# Patient Record
Sex: Female | Born: 1935 | Race: White | Hispanic: No | Smoking: Never smoker
Health system: Southern US, Community
[De-identification: ages and names within clinical notes are randomized; demographics above are authoritative.]

## PROBLEM LIST (undated history)

## (undated) DIAGNOSIS — R0600 Dyspnea, unspecified: Secondary | ICD-10-CM

## (undated) DIAGNOSIS — J449 Chronic obstructive pulmonary disease, unspecified: Secondary | ICD-10-CM

## (undated) DIAGNOSIS — K219 Gastro-esophageal reflux disease without esophagitis: Secondary | ICD-10-CM

## (undated) DIAGNOSIS — K5792 Diverticulitis of intestine, part unspecified, without perforation or abscess without bleeding: Secondary | ICD-10-CM

## (undated) DIAGNOSIS — I219 Acute myocardial infarction, unspecified: Secondary | ICD-10-CM

## (undated) DIAGNOSIS — IMO0002 Reserved for concepts with insufficient information to code with codable children: Secondary | ICD-10-CM

## (undated) DIAGNOSIS — I2510 Atherosclerotic heart disease of native coronary artery without angina pectoris: Secondary | ICD-10-CM

## (undated) DIAGNOSIS — I209 Angina pectoris, unspecified: Secondary | ICD-10-CM

## (undated) DIAGNOSIS — F419 Anxiety disorder, unspecified: Secondary | ICD-10-CM

## (undated) DIAGNOSIS — E039 Hypothyroidism, unspecified: Secondary | ICD-10-CM

## (undated) DIAGNOSIS — I10 Essential (primary) hypertension: Secondary | ICD-10-CM

## (undated) DIAGNOSIS — I639 Cerebral infarction, unspecified: Secondary | ICD-10-CM

## (undated) HISTORY — DX: Anxiety disorder, unspecified: F41.9

## (undated) HISTORY — DX: Reserved for concepts with insufficient information to code with codable children: IMO0002

## (undated) HISTORY — PX: KNEE ARTHROSCOPY: SUR90

## (undated) HISTORY — PX: COCCYX REMOVAL: SHX600

## (undated) HISTORY — DX: Diverticulitis of intestine, part unspecified, without perforation or abscess without bleeding: K57.92

## (undated) HISTORY — PX: LAPAROSCOPIC LOW ANTERIOR RESECTION: SHX5904

## (undated) HISTORY — PX: APPENDECTOMY: SHX54

## (undated) HISTORY — DX: Essential (primary) hypertension: I10

---

## 2001-06-08 ENCOUNTER — Other Ambulatory Visit: Admission: RE | Admit: 2001-06-08 | Discharge: 2001-06-08 | Payer: Self-pay | Admitting: Obstetrics and Gynecology

## 2003-06-14 ENCOUNTER — Ambulatory Visit (HOSPITAL_COMMUNITY): Admission: RE | Admit: 2003-06-14 | Discharge: 2003-06-14 | Payer: Self-pay | Admitting: Obstetrics and Gynecology

## 2004-05-24 ENCOUNTER — Ambulatory Visit (HOSPITAL_COMMUNITY): Admission: RE | Admit: 2004-05-24 | Discharge: 2004-05-24 | Payer: Self-pay | Admitting: Pulmonary Disease

## 2004-07-19 ENCOUNTER — Ambulatory Visit (HOSPITAL_COMMUNITY): Admission: RE | Admit: 2004-07-19 | Discharge: 2004-07-19 | Payer: Self-pay | Admitting: Pulmonary Disease

## 2005-07-14 ENCOUNTER — Ambulatory Visit (HOSPITAL_COMMUNITY): Admission: RE | Admit: 2005-07-14 | Discharge: 2005-07-14 | Payer: Self-pay | Admitting: Pulmonary Disease

## 2006-01-13 ENCOUNTER — Ambulatory Visit (HOSPITAL_COMMUNITY): Admission: RE | Admit: 2006-01-13 | Discharge: 2006-01-13 | Payer: Self-pay | Admitting: Pulmonary Disease

## 2006-01-22 ENCOUNTER — Ambulatory Visit (HOSPITAL_COMMUNITY): Admission: RE | Admit: 2006-01-22 | Discharge: 2006-01-22 | Payer: Self-pay | Admitting: Pulmonary Disease

## 2006-05-05 ENCOUNTER — Ambulatory Visit (HOSPITAL_COMMUNITY): Admission: RE | Admit: 2006-05-05 | Discharge: 2006-05-05 | Payer: Self-pay | Admitting: General Surgery

## 2007-02-18 ENCOUNTER — Ambulatory Visit (HOSPITAL_COMMUNITY): Admission: RE | Admit: 2007-02-18 | Discharge: 2007-02-18 | Payer: Self-pay | Admitting: Pulmonary Disease

## 2007-04-16 ENCOUNTER — Ambulatory Visit: Payer: Self-pay | Admitting: Vascular Surgery

## 2007-04-28 ENCOUNTER — Ambulatory Visit: Payer: Self-pay | Admitting: Vascular Surgery

## 2007-05-05 ENCOUNTER — Ambulatory Visit: Payer: Self-pay | Admitting: Vascular Surgery

## 2007-05-26 ENCOUNTER — Ambulatory Visit: Payer: Self-pay | Admitting: Vascular Surgery

## 2008-01-31 ENCOUNTER — Ambulatory Visit (HOSPITAL_COMMUNITY): Admission: RE | Admit: 2008-01-31 | Discharge: 2008-01-31 | Payer: Self-pay | Admitting: Pulmonary Disease

## 2008-02-22 ENCOUNTER — Ambulatory Visit (HOSPITAL_COMMUNITY): Admission: RE | Admit: 2008-02-22 | Discharge: 2008-02-22 | Payer: Self-pay | Admitting: Obstetrics and Gynecology

## 2009-03-21 ENCOUNTER — Ambulatory Visit (HOSPITAL_COMMUNITY): Admission: RE | Admit: 2009-03-21 | Discharge: 2009-03-21 | Payer: Self-pay | Admitting: Pulmonary Disease

## 2009-05-09 ENCOUNTER — Other Ambulatory Visit: Admission: RE | Admit: 2009-05-09 | Discharge: 2009-05-09 | Payer: Self-pay | Admitting: Obstetrics and Gynecology

## 2010-03-26 ENCOUNTER — Ambulatory Visit (HOSPITAL_COMMUNITY): Admission: RE | Admit: 2010-03-26 | Discharge: 2010-03-26 | Payer: Self-pay | Admitting: Pulmonary Disease

## 2010-12-24 NOTE — Assessment & Plan Note (Signed)
OFFICE VISIT   [name], Cheryl A  DOB:  08/13/1932                                       04/16/2007  EAVWU#:[id number]   FOLLOWUP:  The patient walks in today for continued follow up of her  severe venous hypertension in her right leg.  I had last seen her in  October 2007.  She was having significant pain related to her venous  varicosities at that time.  She has been wearing her graduated  compression stockings thigh high and has had diminishing results with  these.  She reports now that she has pain throughout her right leg and  specifically over her varicosities that limits her daily activities.  She reports specifically it interferes with her house work and  activities of daily living.   PHYSICAL EXAMINATION:  On physical exam, there is no significant change.  She does have marked varicosities throughout her saphenous vein and  tributaries throughout her thigh and calf.  She has no skin breakdown at  her ankle.  She underwent repeat duplex since it had been over one year  since her last study and this did confirm that she continues to have a  patent deep system on the right with no evidence of reflux on the right.  She does have marked reflux throughout her saphenous vein with extensive  tributary varicosities also saphenous vein.   RECOMMENDATIONS:  I recommend that we proceed with right leg laser  ablation and stat phlebectomy for relief of her symptoms.  I discussed  the procedure as outpatient under local anesthesia.  She does report  that she had some issue regarding local anesthesia with her oral  surgeon.  We will obtain information regarding this prior to proceeding.   Larina Earthly, M.D.  Electronically Signed   TFE/MEDQ  D:  04/16/2007  T:  04/19/2007  Job:  [id number]

## 2010-12-24 NOTE — Procedures (Signed)
LOWER EXTREMITY VENOUS REFLUX EXAM   INDICATION:  Chronic right leg varicose vein   EXAM:  Using color-flow imaging and pulse Doppler spectral analysis, the  right common femoral, superficial femoral, popliteal, posterior tibial,  greater and lesser saphenous veins are evaluated.  There is no evidence  suggesting deep venous insufficiency in the right lower extremity.   The right saphenofemoral junction is not competent.  The right GSV is  not competent with the caliber as described below.   The right proximal short saphenous vein was not identified.   GSV Diameter (used if found to be incompetent only)                                            Right    Left  Proximal Greater Saphenous Vein           0.9 cm   cm  Proximal-to-mid-thigh                     0.6 cm   cm  Mid thigh                                 0.6 cm   cm  Mid-distal thigh                          1.8 cm   cm  Distal thigh                              0.4 cm   cm  Knee                                      0.9 cm   cm   IMPRESSION:  1. Right greater saphenous vein reflux is identified with the caliber      ranging from 0.4 cm to 1.8 cm knee to groin.  2. 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  3. The right greater saphenous vein is tortuous in the distal      thigh/popliteal area.  4. There is no evidence of significant deep venous insufficiency in      the left lower extremity.  5. The deep venous system is competent.  6. The right lesser saphenous vein is not identified.   ___________________________________________  Larina Earthly, M.D.   MC/MEDQ  D:  04/16/2007  T:  04/16/2007  Job:  [id number]

## 2010-12-24 NOTE — Assessment & Plan Note (Signed)
OFFICE VISIT   [name], Cheryl A  DOB:  [birth year]                                       05/05/2007  WUXLK#:[id number]   Patient presents today for followup of her right leg laser ablation and  stab phlebectomy.  She has done well with the usual amount of  discomfort.  She does have an area of approximately 2 x 3 cm over the  lateral knee in the web space, where it looks as though the compression  garment and Ace wrap has caused a superficial ulceration.  I explained  to her wound care for this.  She is going to [place] in four weeks, and  we will see her again in three weeks to assure that everything looks  okay.  She did undergo a duplex today, which showed closure of her  saphenous vein and wide patency of her common femoral vein with no  evidence of thrombus or injury.   Larina Earthly, M.D.  Electronically Signed   TFE/MEDQ  D:  05/05/2007  T:  05/06/2007  Job:  [id number]

## 2010-12-24 NOTE — Assessment & Plan Note (Signed)
OFFICE VISIT   [name], [name]  DOB:  [birth year]                                       05/26/2007  [id number]#:[id number]   Patient presents today for continued followup of her laser ablation/stab  phlebectomy of her right greater saphenous vein and tributaries.  She  had an irritation in the lateral space behind her right knee with  irritation from the compression garment and actual ulceration over this  area.  She is seen today for continued followup of this.  She does have  the usual amount of thickness in the areae of the laser ablation and  also stab phlebectomies.  The area now has an eschar present over the  area behind her right lateral knee.  This shows no evidence of  infection.  She reports that she has had some mild bleeding from this  occasionally, but this has now resolved.   I am pleased with her recovery.  She is heading to [place] for the  winter and will notify us when she returns in the spring for a final  check on this.   Larina Earthly, M.D.  Electronically Signed   TFE/MEDQ  D:  05/26/2007  T:  05/27/2007  Job:  [id number]

## 2010-12-27 NOTE — H&P (Signed)
Cheryl [name], Cheryl [name]                ACCOUNT NO.:  [id number]   MEDICAL RECORD NO.:  [id number]           PATIENT TYPE:  AMB   LOCATION:                                FACILITY:  [facility]   PHYSICIAN:  Dalia Heading, M.D.  DATE OF BIRTH:  Dec 18, 1932   DATE OF ADMISSION:  DATE OF DISCHARGE:  LH                                HISTORY & PHYSICAL   CHIEF COMPLAINT:  Colon polyps.   HISTORY OF PRESENT ILLNESS:  The patient is a 77 year old white female who  is referred for endoscopic evaluation.  She needs a colonoscopy for follow-  up of colon polyps.  She had a colonoscopy with polypectomy 6 years ago in  [place].  No abdominal pain, weight loss, nausea, vomiting, diarrhea,  constipation, melena, hematochezia have been noted.  There is no family  history of colon carcinoma.   CURRENT MEDICATIONS:  Plavix which she is holding, verapamil, doxazosin,  Actonel, indapamide.   ALLERGIES:  PENICILLIN.   PAST MEDICAL HISTORY:  Hypertension.   PAST SURGICAL HISTORY:  Appendectomy.   REVIEW OF SYSTEMS:  Noncontributory.   PHYSICAL EXAMINATION:  GENERAL APPEARANCE:  The patient is a well-developed,  well-nourished white female in no acute distress.  LUNGS:  Clear to auscultation with equal breath sounds bilaterally.  HEART:  Regular rate and rhythm without S3, S4 or murmurs.  ABDOMEN:  Soft, nontender, nondistended.  No hepatosplenomegaly or masses  noted.  RECTAL:  Examination was deferred to the procedure.   IMPRESSION:  History of colon polyps.   PLAN:  The patient was scheduled for colonoscopy on May 05, 2006.  Risks and benefits of the procedure including bleeding and perforation were  fully explained to the patient, gave informed consent.      Dalia Heading, M.D.  Electronically Signed     MAJ/MEDQ  D:  04/28/2006  T:  04/28/2006  Job:  [id number]   cc:   Ramon Dredge L. Juanetta Gosling, M.D.  Fax: [phone]

## 2011-04-21 ENCOUNTER — Other Ambulatory Visit (HOSPITAL_COMMUNITY): Payer: Self-pay | Admitting: Pulmonary Disease

## 2011-04-21 DIAGNOSIS — Z139 Encounter for screening, unspecified: Secondary | ICD-10-CM

## 2011-04-24 ENCOUNTER — Ambulatory Visit (HOSPITAL_COMMUNITY)
Admission: RE | Admit: 2011-04-24 | Discharge: 2011-04-24 | Disposition: A | Discharge status: Home / Self Care | Payer: Medicare Other | Source: Ambulatory Visit | Attending: Pulmonary Disease | Admitting: Pulmonary Disease

## 2011-04-24 DIAGNOSIS — Z1231 Encounter for screening mammogram for malignant neoplasm of breast: Secondary | ICD-10-CM | POA: Insufficient documentation

## 2011-04-24 DIAGNOSIS — Z139 Encounter for screening, unspecified: Secondary | ICD-10-CM

## 2011-05-14 ENCOUNTER — Other Ambulatory Visit (HOSPITAL_COMMUNITY)
Admission: RE | Admit: 2011-05-14 | Discharge: 2011-05-14 | Disposition: A | Discharge status: Home / Self Care | Payer: Medicare Other | Source: Ambulatory Visit | Attending: Obstetrics and Gynecology | Admitting: Obstetrics and Gynecology

## 2011-05-14 ENCOUNTER — Other Ambulatory Visit: Payer: Self-pay | Admitting: Obstetrics and Gynecology

## 2011-05-14 DIAGNOSIS — Z124 Encounter for screening for malignant neoplasm of cervix: Secondary | ICD-10-CM | POA: Insufficient documentation

## 2012-02-26 ENCOUNTER — Other Ambulatory Visit: Payer: Self-pay | Admitting: Obstetrics and Gynecology

## 2012-04-21 ENCOUNTER — Other Ambulatory Visit (HOSPITAL_COMMUNITY): Payer: Self-pay | Admitting: Pulmonary Disease

## 2012-04-21 DIAGNOSIS — Z139 Encounter for screening, unspecified: Secondary | ICD-10-CM

## 2012-04-27 ENCOUNTER — Ambulatory Visit (HOSPITAL_COMMUNITY)
Admission: RE | Admit: 2012-04-27 | Discharge: 2012-04-27 | Disposition: A | Discharge status: Home / Self Care | Payer: Medicare Other | Source: Ambulatory Visit | Attending: Pulmonary Disease | Admitting: Pulmonary Disease

## 2012-04-27 DIAGNOSIS — Z139 Encounter for screening, unspecified: Secondary | ICD-10-CM

## 2012-04-27 DIAGNOSIS — Z1231 Encounter for screening mammogram for malignant neoplasm of breast: Secondary | ICD-10-CM | POA: Insufficient documentation

## 2012-07-15 ENCOUNTER — Other Ambulatory Visit (HOSPITAL_COMMUNITY): Payer: Self-pay | Admitting: Pulmonary Disease

## 2012-07-15 ENCOUNTER — Ambulatory Visit (HOSPITAL_COMMUNITY)
Admission: RE | Admit: 2012-07-15 | Discharge: 2012-07-15 | Disposition: A | Discharge status: Home / Self Care | Payer: Medicare Other | Source: Ambulatory Visit | Attending: Pulmonary Disease | Admitting: Pulmonary Disease

## 2012-07-15 DIAGNOSIS — K625 Hemorrhage of anus and rectum: Secondary | ICD-10-CM

## 2012-07-15 DIAGNOSIS — R109 Unspecified abdominal pain: Secondary | ICD-10-CM | POA: Insufficient documentation

## 2012-07-15 DIAGNOSIS — M4120 Other idiopathic scoliosis, site unspecified: Secondary | ICD-10-CM | POA: Insufficient documentation

## 2012-07-15 DIAGNOSIS — K8020 Calculus of gallbladder without cholecystitis without obstruction: Secondary | ICD-10-CM | POA: Insufficient documentation

## 2012-07-15 MED ORDER — IOHEXOL 300 MG/ML  SOLN
100.0000 mL | Freq: Once | INTRAMUSCULAR | Status: AC | PRN
  Administered 2012-07-15: 100 mL via INTRAVENOUS

## 2012-12-08 HISTORY — PX: FLEXIBLE SIGMOIDOSCOPY: SHX1649

## 2012-12-09 HISTORY — PX: LAPAROSCOPY: SHX197

## 2012-12-13 HISTORY — PX: COLOSTOMY: SHX63

## 2013-01-09 HISTORY — PX: COLONOSCOPY: SHX174

## 2013-06-01 ENCOUNTER — Other Ambulatory Visit (HOSPITAL_COMMUNITY): Payer: Self-pay | Admitting: Pulmonary Disease

## 2013-06-01 DIAGNOSIS — Z139 Encounter for screening, unspecified: Secondary | ICD-10-CM

## 2013-06-06 ENCOUNTER — Ambulatory Visit (HOSPITAL_COMMUNITY): Payer: Medicare Other

## 2013-06-24 ENCOUNTER — Encounter: Payer: Self-pay | Admitting: Obstetrics and Gynecology

## 2013-06-24 ENCOUNTER — Other Ambulatory Visit (HOSPITAL_COMMUNITY)
Admission: RE | Admit: 2013-06-24 | Discharge: 2013-06-24 | Disposition: A | Discharge status: Home / Self Care | Payer: Medicare Other | Source: Ambulatory Visit | Attending: Obstetrics and Gynecology | Admitting: Obstetrics and Gynecology

## 2013-06-24 ENCOUNTER — Ambulatory Visit (INDEPENDENT_AMBULATORY_CARE_PROVIDER_SITE_OTHER): Payer: Medicare Other | Admitting: Obstetrics and Gynecology

## 2013-06-24 VITALS — BP 140/84 | Ht 60.5 in | Wt 127.0 lb

## 2013-06-24 DIAGNOSIS — Z124 Encounter for screening for malignant neoplasm of cervix: Secondary | ICD-10-CM | POA: Insufficient documentation

## 2013-06-24 DIAGNOSIS — Z1151 Encounter for screening for human papillomavirus (HPV): Secondary | ICD-10-CM | POA: Insufficient documentation

## 2013-06-24 NOTE — Patient Instructions (Signed)
Please let your surgeon know of your exam today, and that the Hemoccult remains positive.  This is very likely related to recent bowel surgery, but must be discusssed with the general surgeon

## 2013-06-24 NOTE — Progress Notes (Signed)
Patient ID: [name], female   DOB: 05/08/1933, 79 y.o.   MRN: [id number] Pt states that she has a dropped bladder and had a pessary, it fell out and she never reinserted it and states that it doesn't cause her any trouble now. Pt denies any problems or concerns at this time.    Assessment:  Annual Gyn Exam  Positive hemoccult,  S/p recent colostomy and reversal. Moving back to [place] in 10 days Plan:  1. pap smear done, next pap due 3 yr 2. Pt to notify gen surgeon of + hemoccult when she returns to [facility] in 10 days 3. return annually or prn 3    Annual mammogram advised Subjective:  IDELL HISSONG is a 79 y.o. female No obstetric history on file. who presents for annual exam. No LMP recorded. Patient is postmenopausal. The patient has complaints today of none, has had a lengthy list of medical problemes this year , including colostomy and reversal for diverticular disease, no cancer Patient brings lengthy records from [place]  The following portions of the patient's history were reviewed and updated as appropriate: allergies, current medications, past family history, past medical history, past social history, past surgical history and problem list.  Review of Systems Constitutional: negative Gastrointestinal: positive for diverticulosis, bowel blockage requiring colostomy and reversal Genitourinary: neg  Objective:  BP 140/84  Ht 5' 0.5" (1.537 m)  Wt 127 lb (57.607 kg)  BMI 24.39 kg/m2   BMI: Body mass index is 24.39 kg/(m^2).  General Appearance: Alert, appropriate appearance for age. No acute distress HEENT: Grossly normal Neck / Thyroid:  Cardiovascular: RRR; normal S1, S2, no murmur Lungs: CTA bilaterally Back: No CVAT Breast Exam: No dimpling, nipple retraction or discharge. No masses or nodes. and No masses or nodes.No dimpling, nipple retraction or discharge. Gastrointestinal: Soft, non-tender, no masses or organomegaly Pelvic Exam: Vulva and vagina appear normal.  Bimanual exam reveals normal uterus and adnexa. Rectovaginal: normal rectal, no masses and guaiac positive stool obtained Lymphatic Exam: Non-palpable nodes in neck, clavicular, axillary, or inguinal regions Skin: no rash or abnormalities Neurologic: Normal gait and speech, no tremor  Psychiatric: Alert and oriented, appropriate affect.  Urinalysis:Not done  Christin Bach. MD Pgr [phone] 9:21 AM

## 2013-06-28 ENCOUNTER — Encounter: Payer: Self-pay | Admitting: Gastroenterology

## 2013-06-28 ENCOUNTER — Ambulatory Visit (INDEPENDENT_AMBULATORY_CARE_PROVIDER_SITE_OTHER): Payer: Medicare Other | Admitting: Gastroenterology

## 2013-06-28 ENCOUNTER — Encounter (HOSPITAL_COMMUNITY)
Admission: RE | Admit: 2013-06-28 | Discharge: 2013-06-28 | Disposition: A | Discharge status: Home / Self Care | Payer: Medicare Other | Source: Ambulatory Visit | Attending: Pulmonary Disease | Admitting: Pulmonary Disease

## 2013-06-28 ENCOUNTER — Telehealth: Payer: Self-pay | Admitting: Gastroenterology

## 2013-06-28 VITALS — BP 160/70 | HR 78 | Temp 97.1°F | Ht 61.0 in | Wt 127.4 lb

## 2013-06-28 DIAGNOSIS — D649 Anemia, unspecified: Secondary | ICD-10-CM

## 2013-06-28 LAB — PREPARE RBC (CROSSMATCH)

## 2013-06-28 NOTE — Patient Instructions (Signed)
We have scheduled you for an upper endoscopy in the near future.  IF you see any black, tarry stool or bright red blood in your stool, seek emergency medical attention.  Keep appointment for blood transfusion.  Further recommendations to follow shortly.

## 2013-06-28 NOTE — Telephone Encounter (Signed)
Patient needs to be seen in an urgent slot tomorrow, 11/19. May put with me.   She is getting blood today. I will put the records back up front so they can be with her chart.

## 2013-06-28 NOTE — Progress Notes (Signed)
Primary Care Physician:  Fredirick Maudlin, MD Primary Gastroenterologist:  Dr. Darrick Penna   Chief Complaint  Patient presents with  . Anemia    abnormal labs    HPI:   Cheryl [name] presents today as an urgent work-in at the request of Dr. Juanetta Gosling due to anemia. Found to be heme positive through GYN office. Labs through PCP revealed Hgb 6.1, microcytic anemia. Unclear baseline.   Lives in [place] as main residence but comes here to visit. Diverticulitis Dec 2013, then hospitalized in May 2014 in [place]. After review of multiple records, it appears patient was hospitalized due to complicated diverticulitis with microperforation, sigmoid stricture, and bowel obstruction. She underwent a diverting colostomy in May 2014. She had a flex sig and colonoscopy this summer. Colonoscopy was via colostomy and rectum. No obvious mass or polyp noted. Colostomy reversed at some point shortly after that.   EGD in remote past. Capsule study in remote past. She is unsure how long ago but believes at least 10 years. Denies melena. No hematochezia. Notes chronic LLQ "tenderness", discomfort. Appetite "pretty good". No weight loss. No dysphagia. +reflux. +belching. No PPI. On aspirin 325 mg and Plavix 75 mg. No BC powders or Goody powders. No other NSAIDs. Reports early satiety.   Blood transfusion scheduled for tomorrow.   Past Medical History  Diagnosis Date  . Diverticulitis     complicated, with microperforation, sigmoid stricture  . Degenerative disc disease   . Tuberculosis   . Hypertension   . Anxiety   . Bulging discs     Past Surgical History  Procedure Laterality Date  . Colostomy  Dec 13, 2012    Dec 13, 2012 placed. Reversed   . Laparoscopy  May 2014  . Appendectomy    . Knee arthroscopy Right   . Coccyx removal    . Flexible sigmoidoscopy  December 08, 2012    Dr. Kerby Less in [place]: very edematous sigmoid colon, unable to advance scope past 25-30 cm despite attempt with peds  scope.   . Colonoscopy  June 2014    Dr. Aliene Beams in [place]: via colostomy and rectum. scattered diverticula. no obvious polyps. Hemorrhoids noted in anal canal. Exam limited due to poor prep  . Laparoscopic low anterior resection  summer 2014    with reversal of colostomy    Current Outpatient Prescriptions  Medication Sig Dispense Refill  . alendronate (FOSAMAX) 70 MG tablet       . aspirin 325 MG tablet Take 325 mg by mouth daily.      . clopidogrel (PLAVIX) 75 MG tablet       . indapamide (LOZOL) 1.25 MG tablet       . levothyroxine (SYNTHROID, LEVOTHROID) 50 MCG tablet       . potassium chloride (MICRO-K) 10 MEQ CR capsule 2 (two) times daily.       . verapamil (CALAN-SR) 180 MG CR tablet 2 (two) times daily.        No current facility-administered medications for this visit.    Allergies as of 06/28/2013 - Review Complete 06/28/2013  Allergen Reaction Noted  . Penicillins Hives 06/24/2013    Family History  Problem Relation Age of Onset  . Heart disease Mother   . Stroke Father   . Heart disease Sister   . Colon cancer Neg Hx     History   Social History  . Marital Status: Widowed    Spouse Name: N/A    Number of Children:  N/A  . Years of Education: N/A   Occupational History  . Not on file.   Social History Main Topics  . Smoking status: Never Smoker   . Smokeless tobacco: Never Used  . Alcohol Use: Yes     Comment: occasional  . Drug Use: No  . Sexual Activity: Not Currently    Birth Control/ Protection: Post-menopausal   Other Topics Concern  . Not on file   Social History Narrative  . No narrative on file    Review of Systems: Gen: see HPI CV: Denies chest pain, heart palpitations, peripheral edema, syncope.  Resp: Denies shortness of breath at rest or with exertion. Denies wheezing or cough.  GI: see HPI GU : Denies urinary burning, urinary frequency, urinary hesitancy MS: arthritis Derm: +dry skin Psych: Denies depression, anxiety,  memory loss, and confusion   Physical Exam: BP 160/70  Pulse 78  Temp(Src) 97.1 F (36.2 C) (Oral)  Ht 5\' 1"  (1.549 m)  Wt 127 lb 6.4 oz (57.788 kg)  BMI 24.08 kg/m2 General:   Alert and oriented. Pleasant and cooperative. Well-nourished and well-developed. Appears pale.  Head:  Normocephalic and atraumatic. Eyes:  Without icterus, sclera clear and conjunctiva pink.  Ears:  Normal auditory acuity. Nose:  No deformity, discharge,  or lesions. Mouth:  No deformity or lesions, oral mucosa pink.  Neck:  Supple, without mass or thyromegaly. Lungs:  Clear to auscultation bilaterally. No wheezes, rales, or rhonchi. No distress.  Heart:  S1, S2 present without murmurs appreciated.  Abdomen:  +BS, soft, non-tender and non-distended. No HSM noted. No guarding or rebound. No masses appreciated.  Rectal:  Deferred  Msk:  Mild kyphosis Extremities:  Without clubbing or edema. Neurologic:  Alert and  oriented x4;  grossly normal neurologically. Skin:  Intact without significant lesions or rashes. Cervical Nodes:  No significant cervical adenopathy. Psych:  Alert and cooperative. Normal mood and affect.

## 2013-06-28 NOTE — Telephone Encounter (Signed)
Pt unable to come tomorrow. She was able to be seen today by AS

## 2013-06-28 NOTE — Assessment & Plan Note (Signed)
79 year old female with microcytic anemia, Hgb 6.1, unknown baseline. Blood transfusion scheduled for 11/19. Appears she was heme positive through GYN; she has had quite an interesting summer with hospitalization in [place] secondary to complicated diverticulitis, colostomy placement and reversal, and low anterior resection. Colonoscopy performed June 2014 via colostomy and rectum, limited due to poor prep but no obvious mass or polyps.   She has no evidence of hematochezia or melena. Vague upper GI symptoms to include early satiety, belching, and new-onset intermittent reflux. As she is on Plavix and ASA 325 mg daily, concern for occult upper GI source or small bowel etiology. Discussed proceeding with an EGD as first line in investigation; may ultimately need a capsule study. Colonoscopy performed this summer, and she does not have any overt signs of rectal bleeding. For this reason, proceed with EGD with Dr. Darrick Penna  and possible capsule to be scheduled if EGD negative.   Discussed in detail signs and symptoms that would necessitate urgent GI evaluation. She and her daughter stated understanding.

## 2013-06-29 ENCOUNTER — Encounter (HOSPITAL_COMMUNITY)
Admission: RE | Admit: 2013-06-29 | Discharge: 2013-06-29 | Disposition: A | Discharge status: Home / Self Care | Payer: Medicare Other | Source: Ambulatory Visit | Attending: Pulmonary Disease | Admitting: Pulmonary Disease

## 2013-06-29 LAB — HEMOGLOBIN AND HEMATOCRIT, BLOOD
HCT: 20.6 % — ABNORMAL LOW (ref 36.0–46.0)
HCT: 26.8 % — ABNORMAL LOW (ref 36.0–46.0)
Hemoglobin: 6 g/dL — CL (ref 12.0–15.0)
Hemoglobin: 8.2 g/dL — ABNORMAL LOW (ref 12.0–15.0)

## 2013-06-29 NOTE — Progress Notes (Signed)
Hbg/Hct drawn and sent to lab for results.

## 2013-06-29 NOTE — Progress Notes (Signed)
Cc PCP 

## 2013-06-29 NOTE — Progress Notes (Signed)
Given lunch tray. Taking diet well. Voices no c/o at this time.

## 2013-06-29 NOTE — Progress Notes (Signed)
Here for blood transfusion. Dx anemia. 

## 2013-06-30 LAB — TYPE AND SCREEN
ABO/RH(D): O POS
Antibody Screen: POSITIVE
Unit division: 0
Unit division: 0

## 2013-07-01 ENCOUNTER — Encounter (HOSPITAL_COMMUNITY): Payer: Self-pay | Admitting: *Deleted

## 2013-07-01 ENCOUNTER — Encounter (HOSPITAL_COMMUNITY)
Admission: RE | Disposition: A | Discharge status: Home / Self Care | Payer: Self-pay | Source: Ambulatory Visit | Attending: Gastroenterology

## 2013-07-01 ENCOUNTER — Ambulatory Visit (HOSPITAL_COMMUNITY)
Admission: RE | Admit: 2013-07-01 | Discharge: 2013-07-01 | Disposition: A | Discharge status: Home / Self Care | Payer: Medicare Other | Source: Ambulatory Visit | Attending: Gastroenterology | Admitting: Gastroenterology

## 2013-07-01 DIAGNOSIS — K2990 Gastroduodenitis, unspecified, without bleeding: Secondary | ICD-10-CM

## 2013-07-01 DIAGNOSIS — D649 Anemia, unspecified: Secondary | ICD-10-CM

## 2013-07-01 DIAGNOSIS — I10 Essential (primary) hypertension: Secondary | ICD-10-CM | POA: Insufficient documentation

## 2013-07-01 DIAGNOSIS — K2970 Gastritis, unspecified, without bleeding: Secondary | ICD-10-CM

## 2013-07-01 DIAGNOSIS — K625 Hemorrhage of anus and rectum: Secondary | ICD-10-CM

## 2013-07-01 DIAGNOSIS — K2940 Chronic atrophic gastritis without bleeding: Secondary | ICD-10-CM | POA: Insufficient documentation

## 2013-07-01 HISTORY — PX: ESOPHAGOGASTRODUODENOSCOPY: SHX5428

## 2013-07-01 SURGERY — EGD (ESOPHAGOGASTRODUODENOSCOPY)
Anesthesia: Moderate Sedation

## 2013-07-01 MED ORDER — MIDAZOLAM HCL 5 MG/5ML IJ SOLN
INTRAMUSCULAR | Status: AC
  Filled 2013-07-01: qty 10

## 2013-07-01 MED ORDER — STERILE WATER FOR IRRIGATION IR SOLN
Status: DC | PRN
  Administered 2013-07-01: 15:00:00

## 2013-07-01 MED ORDER — MIDAZOLAM HCL 5 MG/5ML IJ SOLN
INTRAMUSCULAR | Status: DC | PRN
  Administered 2013-07-01: 1 mg via INTRAVENOUS
  Administered 2013-07-01 (×2): 2 mg via INTRAVENOUS

## 2013-07-01 MED ORDER — SODIUM CHLORIDE 0.9 % IV SOLN
INTRAVENOUS | Status: DC
  Administered 2013-07-01: 14:00:00 via INTRAVENOUS

## 2013-07-01 MED ORDER — BUTAMBEN-TETRACAINE-BENZOCAINE 2-2-14 % EX AERO
INHALATION_SPRAY | CUTANEOUS | Status: DC | PRN
  Administered 2013-07-01: 2 via TOPICAL

## 2013-07-01 MED ORDER — MEPERIDINE HCL 100 MG/ML IJ SOLN
INTRAMUSCULAR | Status: AC
  Filled 2013-07-01: qty 2

## 2013-07-01 MED ORDER — MEPERIDINE HCL 100 MG/ML IJ SOLN
INTRAMUSCULAR | Status: DC | PRN
  Administered 2013-07-01 (×3): 25 mg via INTRAVENOUS

## 2013-07-01 NOTE — Op Note (Signed)
[facility] [address] [place], [address]   ENDOSCOPY PROCEDURE REPORT  PATIENT: Cheryl [name], Cheryl [name]  MR#: [id number] BIRTHDATE: 1933/08/10 , 80  yrs. old GENDER: Female  ENDOSCOPIST: Jonette Eva, MD REFERRED XB:JYNWGN Juanetta Gosling, M.D.  PROCEDURE DATE: 07/01/2013 PROCEDURE:   EGD w/ biopsy  INDICATIONS:Anemia. PT HAS RECTAL BLEEDING. MEDICATIONS: Demerol 75 mg IV and Versed 5 mg IV TOPICAL ANESTHETIC:   Cetacaine Spray  DESCRIPTION OF PROCEDURE:     Physical exam was performed.  Informed consent was obtained from the patient after explaining the benefits, risks, and alternatives to the procedure.  The patient was connected to the monitor and placed in the left lateral position.  Continuous oxygen was provided by nasal cannula and IV medicine administered through an indwelling cannula.  After administration of sedation, the patients esophagus was intubated and the EG-[id number] ([id number])  endoscope was advanced under direct visualization to the second portion of the duodenum.  The scope was removed slowly by carefully examining the color, texture, anatomy, and integrity of the mucosa on the way out.  The patient was recovered in endoscopy and discharged home in satisfactory condition.   ESOPHAGUS: The mucosa of the esophagus appeared normal.   STOMACH: Mild non-erosive gastritis (inflammation) was found in the gastric antrum.  Multiple biopsies were performed using cold forceps. DUODENUM: The duodenal mucosa showed no abnormalities in the bulb and second portion of the duodenum.  Cold forcep biopsies were taken in the second portion.  COMPLICATIONS:   None  ENDOSCOPIC IMPRESSION: 1.   NO OBVIOU SOSURCE FOR ANEMIA IDENTIFIED. 2.   MILD Non-erosive gastritis  RECOMMENDATIONS: AVOID TRIGGERS FOR GASTRITIS.  SEE INFO BELOW. FOLLOW A LOW FAT DIET. BIOPSY WILL BE BACK IN 7 DAYS.  WILL DECIDE IF A CAPSULE STUDY/TCS IS NEEDED AT THAT TIME.  FOLLOW UP IN 4  MOS.   REPEAT EXAM:   _______________________________ Rosalie DoctorJonette Eva, MD 07/01/2013 3:14 PM

## 2013-07-01 NOTE — H&P (Signed)
Primary Care Physician:  Fredirick Maudlin, MD Primary Gastroenterologist:  Dr. Darrick Penna  Pre-Procedure History & Physical: HPI:  Cheryl [name] is a 79 y.o. female here for  ANEMIA.  Past Medical History  Diagnosis Date  . Diverticulitis     complicated, with microperforation, sigmoid stricture  . Degenerative disc disease   . Tuberculosis   . Hypertension   . Anxiety   . Bulging discs     Past Surgical History  Procedure Laterality Date  . Colostomy  Dec 13, 2012    Dec 13, 2012 placed. Reversed   . Laparoscopy  May 2014  . Appendectomy    . Knee arthroscopy Right   . Coccyx removal    . Flexible sigmoidoscopy  December 08, 2012    Dr. Kerby Less in [place]: very edematous sigmoid colon, unable to advance scope past 25-30 cm despite attempt with peds scope.   . Colonoscopy  June 2014    Dr. Aliene Beams in [place]: via colostomy and rectum. scattered diverticula. no obvious polyps. Hemorrhoids noted in anal canal. Exam limited due to poor prep  . Laparoscopic low anterior resection  summer 2014    with reversal of colostomy    Prior to Admission medications   Medication Sig Start Date End Date Taking? Authorizing Provider  aspirin 325 MG tablet Take 325 mg by mouth daily.   Yes Historical Provider, MD  clopidogrel (PLAVIX) 75 MG tablet  06/02/13  Yes Historical Provider, MD  indapamide (LOZOL) 1.25 MG tablet  06/02/13  Yes Historical Provider, MD  levothyroxine (SYNTHROID, LEVOTHROID) 50 MCG tablet  05/31/13  Yes Historical Provider, MD  potassium chloride (MICRO-K) 10 MEQ CR capsule 2 (two) times daily.  05/31/13  Yes Historical Provider, MD  verapamil (CALAN-SR) 180 MG CR tablet 2 (two) times daily.  05/31/13  Yes Historical Provider, MD  alendronate (FOSAMAX) 70 MG tablet  06/04/13   Historical Provider, MD    Allergies as of 06/28/2013 - Review Complete 06/28/2013  Allergen Reaction Noted  . Penicillins Hives 06/24/2013    Family History  Problem Relation Age of  Onset  . Heart disease Mother   . Stroke Father   . Heart disease Sister   . Colon cancer Neg Hx     History   Social History  . Marital Status: Widowed    Spouse Name: N/A    Number of Children: N/A  . Years of Education: N/A   Occupational History  . Not on file.   Social History Main Topics  . Smoking status: Never Smoker   . Smokeless tobacco: Never Used  . Alcohol Use: Yes     Comment: occasional  . Drug Use: No  . Sexual Activity: Not Currently    Birth Control/ Protection: Post-menopausal   Other Topics Concern  . Not on file   Social History Narrative  . No narrative on file    Review of Systems: See HPI, otherwise negative ROS   Physical Exam: BP 151/76  Pulse 67  Temp(Src) 97.6 F (36.4 C) (Oral)  Resp 22  Ht 5\' 1"  (1.549 m)  Wt 127 lb (57.607 kg)  BMI 24.01 kg/m2  SpO2 100% General:   Alert,  pleasant and cooperative in NAD Head:  Normocephalic and atraumatic. Neck:  Supple; Lungs:  Clear throughout to auscultation.    Heart:  Regular rate and rhythm. Abdomen:  Soft, nontender and nondistended. Normal bowel sounds, without guarding, and without rebound.   Neurologic:  Alert and  oriented  x4;  grossly normal neurologically.  Impression/Plan:     Anemia/heme pos stools  PLAN:  1. EGD TODAY

## 2013-07-01 NOTE — Progress Notes (Signed)
REVIEWED.  

## 2013-07-03 ENCOUNTER — Emergency Department (HOSPITAL_COMMUNITY)
Admission: EM | Admit: 2013-07-03 | Discharge: 2013-07-03 | Disposition: A | Discharge status: Home / Self Care | Payer: Medicare Other | Attending: Emergency Medicine | Admitting: Emergency Medicine

## 2013-07-03 ENCOUNTER — Encounter (HOSPITAL_COMMUNITY): Payer: Self-pay | Admitting: Emergency Medicine

## 2013-07-03 DIAGNOSIS — Z7982 Long term (current) use of aspirin: Secondary | ICD-10-CM | POA: Insufficient documentation

## 2013-07-03 DIAGNOSIS — Z880 Allergy status to penicillin: Secondary | ICD-10-CM | POA: Insufficient documentation

## 2013-07-03 DIAGNOSIS — Z8659 Personal history of other mental and behavioral disorders: Secondary | ICD-10-CM | POA: Insufficient documentation

## 2013-07-03 DIAGNOSIS — I10 Essential (primary) hypertension: Secondary | ICD-10-CM | POA: Insufficient documentation

## 2013-07-03 DIAGNOSIS — Z8619 Personal history of other infectious and parasitic diseases: Secondary | ICD-10-CM | POA: Insufficient documentation

## 2013-07-03 DIAGNOSIS — Z8739 Personal history of other diseases of the musculoskeletal system and connective tissue: Secondary | ICD-10-CM | POA: Insufficient documentation

## 2013-07-03 DIAGNOSIS — Z7902 Long term (current) use of antithrombotics/antiplatelets: Secondary | ICD-10-CM | POA: Insufficient documentation

## 2013-07-03 DIAGNOSIS — Z79899 Other long term (current) drug therapy: Secondary | ICD-10-CM | POA: Insufficient documentation

## 2013-07-03 DIAGNOSIS — K922 Gastrointestinal hemorrhage, unspecified: Secondary | ICD-10-CM

## 2013-07-03 LAB — CBC
HCT: 26.9 % — ABNORMAL LOW (ref 36.0–46.0)
MCHC: 31.2 g/dL (ref 30.0–36.0)
MCV: 77.5 fL — ABNORMAL LOW (ref 78.0–100.0)
Platelets: 216 10*3/uL (ref 150–400)
RDW: 19 % — ABNORMAL HIGH (ref 11.5–15.5)

## 2013-07-03 LAB — BASIC METABOLIC PANEL
BUN: 16 mg/dL (ref 6–23)
Chloride: 96 mEq/L (ref 96–112)
Creatinine, Ser: 0.99 mg/dL (ref 0.50–1.10)
GFR calc Af Amer: 62 mL/min — ABNORMAL LOW (ref >90)
GFR calc non Af Amer: 53 mL/min — ABNORMAL LOW (ref >90)
Glucose, Bld: 115 mg/dL — ABNORMAL HIGH (ref 70–99)
Potassium: 3.2 mEq/L — ABNORMAL LOW (ref 3.5–5.1)

## 2013-07-03 NOTE — ED Notes (Signed)
Pt brought stool sample from home. EDP aware and reported wanted that sample tested. Sample tested. POC occult blood positive. EDP aware. No new orders given.

## 2013-07-03 NOTE — ED Provider Notes (Signed)
CSN: [id number]     Arrival date & time 07/03/13  1222 History  This chart was scribed for Cheryl Jakes, MD by Cheryl [name], ED Scribe. This patient was seen in room APA14/APA14 and the patient's care was started at 1:40 PM.    Chief Complaint  Patient presents with  . GI Bleeding    Patient is a 79 y.o. female presenting with hematochezia. The history is provided by the patient. No language interpreter was used.  Rectal Bleeding Quality:  Black and tarry Amount:  Moderate Duration: this morning. Timing: once. Associated symptoms: no abdominal pain, no fever and no vomiting    HPI Comments: Cheryl STIVERSON is a 79 y.o. female who presents to the Emergency Department complaining of one episode of rectal bleeding described as thick dark black stool noted this morning. She was seen by GYN one week ago and had bright red blood noted in her rectum. She was told to follow up with her PCP and was noted to have a blood count of 6. She had a 2 unit blood transfusion the next day with a hemoglobin increase to 8.2. She was then seen by GI and had an endoscopy performed with no significant findings. Official findings will be back next week and pt states that the next step is a capsule study. Last colonoscopy was 6 months ago done in [place]. She had a colostomy, approximately 10 inches removed, for diverticulitis. The colon was repaired in June or July 2014. Pt was instructed to be evaluated if she noted bloodly or black stool. Pt admits that she was started on iron pills 3 days ago as well. She states that she has been having blood in the stool "for awhile", but she became concerned this morning when it appeared darker. She denies any other symptoms.  PCP is Dr. Juanetta [name]  Past Medical History  Diagnosis Date  . Diverticulitis     complicated, with microperforation, sigmoid stricture  . Degenerative disc disease   . Tuberculosis   . Hypertension   . Anxiety   . Bulging discs    Past Surgical  History  Procedure Laterality Date  . Colostomy  Dec 13, 2012    Dec 13, 2012 placed. Reversed   . Laparoscopy  May 2014  . Appendectomy    . Knee arthroscopy Right   . Coccyx removal    . Flexible sigmoidoscopy  December 08, 2012    Dr. Kerby Less in [place]: very edematous sigmoid colon, unable to advance scope past 25-30 cm despite attempt with peds scope.   . Colonoscopy  June 2014    Dr. Aliene Beams in [place]: via colostomy and rectum. scattered diverticula. no obvious polyps. Hemorrhoids noted in anal canal. Exam limited due to poor prep  . Laparoscopic low anterior resection  summer 2014    with reversal of colostomy   Family History  Problem Relation Age of Onset  . Heart disease Mother   . Stroke Father   . Heart disease Sister   . Colon cancer Neg Hx    History  Substance Use Topics  . Smoking status: Never Smoker   . Smokeless tobacco: Never Used  . Alcohol Use: Yes     Comment: occasional   No OB history provided.  Review of Systems  Constitutional: Negative for fever and chills.  HENT: Negative for congestion and sore throat.   Eyes: Negative for visual disturbance.  Respiratory: Negative for cough and shortness of breath.   Cardiovascular: Negative  for chest pain and leg swelling.  Gastrointestinal: Positive for blood in stool and hematochezia. Negative for nausea, vomiting, abdominal pain and diarrhea.  Genitourinary: Negative for dysuria.  Musculoskeletal: Negative for back pain and neck pain.  Skin: Negative for rash.  Neurological: Negative for headaches.  Hematological: Bruises/bleeds easily (pt on plavix ).  Psychiatric/Behavioral: Negative for confusion.    Allergies  Penicillins  Home Medications   Current Outpatient Rx  Name  Route  Sig  Dispense  Refill  . alendronate (FOSAMAX) 70 MG tablet   Oral   Take 70 mg by mouth once a week. Takes on Sunday         . aspirin 325 MG tablet   Oral   Take 325 mg by mouth daily.         .  clopidogrel (PLAVIX) 75 MG tablet   Oral   Take 75 mg by mouth once.          . indapamide (LOZOL) 1.25 MG tablet   Oral   Take 1.25 mg by mouth daily.          . IRON PO   Oral   Take 1 tablet by mouth daily.         Marland Kitchen levothyroxine (SYNTHROID, LEVOTHROID) 50 MCG tablet   Oral   Take 50 mcg by mouth daily before breakfast.          . potassium chloride (MICRO-K) 10 MEQ CR capsule   Oral   Take 10 mEq by mouth 2 (two) times daily.          . verapamil (CALAN-SR) 180 MG CR tablet   Oral   Take 180 mg by mouth 2 (two) times daily.           Triage Vitals: BP 99/72  Pulse 73  Temp(Src) 98.5 F (36.9 C)  Resp 18  Ht 5' (1.524 m)  Wt 127 lb (57.607 kg)  BMI 24.80 kg/m2  SpO2 100%  Physical Exam  Nursing note and vitals reviewed. Constitutional: She is oriented to person, place, and time. She appears well-developed and well-nourished. No distress.  HENT:  Head: Normocephalic and atraumatic.  Mouth/Throat: Oropharynx is clear and moist.  Eyes: Conjunctivae and EOM are normal. Pupils are equal, round, and reactive to light.  Sclera are clear  Neck: Neck supple. No tracheal deviation present.  Cardiovascular: Normal rate and regular rhythm.   No murmur heard. Pulses:      Dorsalis pedis pulses are 2+ on the right side, and 2+ on the left side.  Pulmonary/Chest: Effort normal and breath sounds normal. No respiratory distress. She has no wheezes.  Abdominal: Soft. Bowel sounds are normal. She exhibits no distension. There is no tenderness.  Well-healed surgical scars  Musculoskeletal: Normal range of motion. She exhibits no edema (no ankle swelling).  Lymphadenopathy:    She has no cervical adenopathy.  Neurological: She is alert and oriented to person, place, and time. No cranial nerve deficit.  Pt able to move both sets of fingers and toes  Skin: Skin is warm and dry. No rash noted.  Psychiatric: She has a normal mood and affect. Her behavior is normal.     ED Course  Procedures (including critical care time)  DIAGNOSTIC STUDIES: Oxygen Saturation is 100% on room air, normal by my interpretation.    COORDINATION OF CARE: 2:00 PM-Discussed treatment plan which includes CBC panel, BMP and GUAIAC test on stool sample with pt at bedside and pt agreed to  plan.   Labs Review Labs Reviewed  CBC - Abnormal; Notable for the following:    RBC 3.47 (*)    Hemoglobin 8.4 (*)    HCT 26.9 (*)    MCV 77.5 (*)    MCH 24.2 (*)    RDW 19.0 (*)    All other components within normal limits  BASIC METABOLIC PANEL - Abnormal; Notable for the following:    Sodium 131 (*)    Potassium 3.2 (*)    Glucose, Bld 115 (*)    GFR calc non Af Amer 53 (*)    GFR calc Af Amer 62 (*)    All other components within normal limits  SAMPLE TO BLOOD BANK   Results for orders placed during the hospital encounter of 07/03/13  CBC      Result Value Range   WBC 4.0  4.0 - 10.5 K/uL   RBC 3.47 (*) 3.87 - 5.11 MIL/uL   Hemoglobin 8.4 (*) 12.0 - 15.0 g/dL   HCT 96.2 (*) 95.2 - 84.1 %   MCV 77.5 (*) 78.0 - 100.0 fL   MCH 24.2 (*) 26.0 - 34.0 pg   MCHC 31.2  30.0 - 36.0 g/dL   RDW 32.4 (*) 40.1 - 02.7 %   Platelets 216  150 - 400 K/uL  BASIC METABOLIC PANEL      Result Value Range   Sodium 131 (*) 135 - 145 mEq/L   Potassium 3.2 (*) 3.5 - 5.1 mEq/L   Chloride 96  96 - 112 mEq/L   CO2 24  19 - 32 mEq/L   Glucose, Bld 115 (*) 70 - 99 mg/dL   BUN 16  6 - 23 mg/dL   Creatinine, Ser 2.53  0.50 - 1.10 mg/dL   Calcium 9.4  8.4 - 66.4 mg/dL   GFR calc non Af Amer 53 (*) >90 mL/min   GFR calc Af Amer 62 (*) >90 mL/min  SAMPLE TO BLOOD BANK      Result Value Range   Blood Bank Specimen SAMPLE AVAILABLE FOR TESTING     Sample Expiration 07/06/2013     Results for orders placed during the hospital encounter of 07/03/13  CBC      Result Value Range   WBC 4.0  4.0 - 10.5 K/uL   RBC 3.47 (*) 3.87 - 5.11 MIL/uL   Hemoglobin 8.4 (*) 12.0 - 15.0 g/dL   HCT 40.3 (*)  47.4 - 46.0 %   MCV 77.5 (*) 78.0 - 100.0 fL   MCH 24.2 (*) 26.0 - 34.0 pg   MCHC 31.2  30.0 - 36.0 g/dL   RDW 25.9 (*) 56.3 - 87.5 %   Platelets 216  150 - 400 K/uL  BASIC METABOLIC PANEL      Result Value Range   Sodium 131 (*) 135 - 145 mEq/L   Potassium 3.2 (*) 3.5 - 5.1 mEq/L   Chloride 96  96 - 112 mEq/L   CO2 24  19 - 32 mEq/L   Glucose, Bld 115 (*) 70 - 99 mg/dL   BUN 16  6 - 23 mg/dL   Creatinine, Ser 6.43  0.50 - 1.10 mg/dL   Calcium 9.4  8.4 - 32.9 mg/dL   GFR calc non Af Amer 53 (*) >90 mL/min   GFR calc Af Amer 62 (*) >90 mL/min  SAMPLE TO BLOOD BANK      Result Value Range   Blood Bank Specimen SAMPLE AVAILABLE FOR TESTING  Sample Expiration 07/06/2013       Imaging Review No results found.  EKG Interpretation    Date/Time:  Sunday July 03 2013 12:27:56 EST Ventricular Rate:  74 PR Interval:  152 QRS Duration: 92 QT Interval:  406 QTC Calculation: 450 R Axis:   32 Text Interpretation:  Normal sinus rhythm Normal ECG No previous ECGs available Confirmed by COOK  MD, BRIAN ([id number]) on 07/03/2013 1:05:02 PM            MDM   1. GI bleed    Patient with melanotic stool however did just start iron a few days ago. And noticed a dark stools today actually started the iron to 3 days ago. Increased blackness of the stools may be related to that she did have dark stools prior to that and has had a history of a slow GI bleed. Stool is heme positive here or call plate there is no gross blood. Patient's hemoglobin is baseline following her transfusion on November 18. Patient is traveling to [place] tomorrow currently vital signs are stable she feels well enough most likely contractile. She stays there during the winter and has physicians there that treat her. Vital signs without any significant evidence of GI blood loss labs without significant evidence of GI blood loss.   I personally performed the services described in this documentation, which was scribed  in my presence. The recorded information has been reviewed and is accurate.     Cheryl Jakes, MD 07/03/13 [phone]

## 2013-07-03 NOTE — ED Notes (Signed)
Pt c/o increased in rectal bleeding-darker in color. Pt also c/o weakness. Pt had blood transfusion on Wednesday and endoscopy on Friday. Last colonoscopy x 6 months ago. Pt had colostomy due to bowel obstruction in May and reversal in June. Nad noted.

## 2013-07-04 LAB — SAMPLE TO BLOOD BANK

## 2013-07-08 ENCOUNTER — Telehealth: Payer: Self-pay | Admitting: Gastroenterology

## 2013-07-08 NOTE — Telephone Encounter (Signed)
Called patient TO DISCUSS RESULTS. SHE IS BACK IN [place]. EXPLAINED SHE HAS ATROPHIC GASTRITIS, WHICH MEANS HER ACID PRODUCING CELLS DON'T WORK RIGHT, SO SHE WON'T ABSORB ORAL IRON VERY WELL. HER SMALL BOWEL BIOPSIES ARE NORMAL. SHE WAS IN ED 2 DAYS AFTER EGD FOR RECTAL BLEEDING(Hb 8.4). IRON ALSO MAKING STOOLS BLACK. RECOMMENDED TCS TO CONTINUE HER EVALUATION FOR ANEMIA. BECAUSE SHE HAS ATROPHIC GASTRITIS SHE WILL LIKELY NOT ABSORB ORAL FE AND SHE WILL LIKELY NEED IVFE. SHE CONTINUES ON ASA/PLAVIX FOR AN UNCERTAIN REASON. SHE IS NOT ON A PPI. TRIED TO CALL DR. Jones Broom [phone] BUT [facility] IS CLOSED TODAY. WILL CALL MON AND FAX RECORDS NOV 2014 TO PRESENT.

## 2013-07-11 ENCOUNTER — Encounter (HOSPITAL_COMMUNITY): Payer: Self-pay | Admitting: Gastroenterology

## 2013-07-11 NOTE — Telephone Encounter (Signed)
CALLED TO SPEAK TO DR. KANER. LEFT MESSAGE TO CALL [phone].

## 2013-07-11 NOTE — Telephone Encounter (Signed)
FAXED RECORDS TO DR. Jones Broom [phone] FAX NUMBER. [phone] OFFICE NUMBER.

## 2013-07-11 NOTE — Telephone Encounter (Signed)
SPOKE TO DR. KANER. EXPLAINED PT PRESENTED WITH A HB 6. EGD REVEALED ATROPHIC GASTRITIS, NL SMALL BOWEL BX. DISCUSSED WITH PT ON FRI NOV 28. RECOMMENDED PT BE SEEN AND EVALUATED TO CONSIDER TCS AND/OR CAPSULE ENDOSCOPY.

## 2013-09-06 LAB — CBC
HEMATOCRIT: 14 %
HGB: 6.1 g/dL

## 2013-12-22 ENCOUNTER — Other Ambulatory Visit (HOSPITAL_COMMUNITY): Payer: Self-pay | Admitting: Pulmonary Disease

## 2013-12-22 DIAGNOSIS — Z1231 Encounter for screening mammogram for malignant neoplasm of breast: Secondary | ICD-10-CM

## 2013-12-26 ENCOUNTER — Ambulatory Visit (HOSPITAL_COMMUNITY)
Admission: RE | Admit: 2013-12-26 | Discharge: 2013-12-26 | Disposition: A | Discharge status: Home / Self Care | Payer: Medicare Other | Source: Ambulatory Visit | Attending: Pulmonary Disease | Admitting: Pulmonary Disease

## 2013-12-26 DIAGNOSIS — Z1231 Encounter for screening mammogram for malignant neoplasm of breast: Secondary | ICD-10-CM

## 2014-01-18 ENCOUNTER — Other Ambulatory Visit (HOSPITAL_COMMUNITY): Payer: Self-pay | Admitting: Pulmonary Disease

## 2014-01-18 DIAGNOSIS — Z780 Asymptomatic menopausal state: Secondary | ICD-10-CM

## 2014-01-23 ENCOUNTER — Ambulatory Visit (HOSPITAL_COMMUNITY)
Admission: RE | Admit: 2014-01-23 | Discharge: 2014-01-23 | Disposition: A | Discharge status: Home / Self Care | Payer: Medicare Other | Source: Ambulatory Visit | Attending: Pulmonary Disease | Admitting: Pulmonary Disease

## 2014-01-23 DIAGNOSIS — Z780 Asymptomatic menopausal state: Secondary | ICD-10-CM | POA: Insufficient documentation

## 2014-08-14 DIAGNOSIS — K2970 Gastritis, unspecified, without bleeding: Secondary | ICD-10-CM | POA: Diagnosis not present

## 2014-08-14 DIAGNOSIS — K5730 Diverticulosis of large intestine without perforation or abscess without bleeding: Secondary | ICD-10-CM | POA: Diagnosis not present

## 2014-08-14 DIAGNOSIS — D509 Iron deficiency anemia, unspecified: Secondary | ICD-10-CM | POA: Diagnosis not present

## 2014-08-15 DIAGNOSIS — D509 Iron deficiency anemia, unspecified: Secondary | ICD-10-CM | POA: Diagnosis not present

## 2014-09-05 DIAGNOSIS — I209 Angina pectoris, unspecified: Secondary | ICD-10-CM | POA: Diagnosis not present

## 2014-09-05 DIAGNOSIS — E009 Congenital iodine-deficiency syndrome, unspecified: Secondary | ICD-10-CM | POA: Diagnosis not present

## 2014-09-05 DIAGNOSIS — R7309 Other abnormal glucose: Secondary | ICD-10-CM | POA: Diagnosis not present

## 2014-09-05 DIAGNOSIS — R531 Weakness: Secondary | ICD-10-CM | POA: Diagnosis not present

## 2014-09-05 DIAGNOSIS — R011 Cardiac murmur, unspecified: Secondary | ICD-10-CM | POA: Diagnosis not present

## 2014-09-05 DIAGNOSIS — I10 Essential (primary) hypertension: Secondary | ICD-10-CM | POA: Diagnosis not present

## 2014-09-05 DIAGNOSIS — E780 Pure hypercholesterolemia: Secondary | ICD-10-CM | POA: Diagnosis not present

## 2014-09-06 DIAGNOSIS — I6529 Occlusion and stenosis of unspecified carotid artery: Secondary | ICD-10-CM | POA: Diagnosis not present

## 2014-09-06 DIAGNOSIS — R011 Cardiac murmur, unspecified: Secondary | ICD-10-CM | POA: Diagnosis not present

## 2014-09-27 DIAGNOSIS — M79604 Pain in right leg: Secondary | ICD-10-CM | POA: Diagnosis not present

## 2014-09-27 DIAGNOSIS — M7120 Synovial cyst of popliteal space [Baker], unspecified knee: Secondary | ICD-10-CM | POA: Diagnosis not present

## 2014-09-27 DIAGNOSIS — I82401 Acute embolism and thrombosis of unspecified deep veins of right lower extremity: Secondary | ICD-10-CM | POA: Diagnosis not present

## 2014-09-27 DIAGNOSIS — M7121 Synovial cyst of popliteal space [Baker], right knee: Secondary | ICD-10-CM | POA: Diagnosis not present

## 2014-09-27 DIAGNOSIS — M25561 Pain in right knee: Secondary | ICD-10-CM | POA: Diagnosis not present

## 2014-10-04 DIAGNOSIS — I209 Angina pectoris, unspecified: Secondary | ICD-10-CM | POA: Diagnosis not present

## 2014-10-11 DIAGNOSIS — R9439 Abnormal result of other cardiovascular function study: Secondary | ICD-10-CM | POA: Diagnosis not present

## 2014-10-11 DIAGNOSIS — M7121 Synovial cyst of popliteal space [Baker], right knee: Secondary | ICD-10-CM | POA: Diagnosis not present

## 2015-02-14 ENCOUNTER — Emergency Department (HOSPITAL_COMMUNITY)
Admission: EM | Admit: 2015-02-14 | Discharge: 2015-02-15 | Disposition: A | Discharge status: Home / Self Care | Payer: Medicare Other | Attending: Emergency Medicine | Admitting: Emergency Medicine

## 2015-02-14 ENCOUNTER — Encounter (HOSPITAL_COMMUNITY): Payer: Self-pay | Admitting: Emergency Medicine

## 2015-02-14 DIAGNOSIS — Z8739 Personal history of other diseases of the musculoskeletal system and connective tissue: Secondary | ICD-10-CM | POA: Insufficient documentation

## 2015-02-14 DIAGNOSIS — Z880 Allergy status to penicillin: Secondary | ICD-10-CM | POA: Diagnosis not present

## 2015-02-14 DIAGNOSIS — Z8659 Personal history of other mental and behavioral disorders: Secondary | ICD-10-CM | POA: Insufficient documentation

## 2015-02-14 DIAGNOSIS — Z79899 Other long term (current) drug therapy: Secondary | ICD-10-CM | POA: Insufficient documentation

## 2015-02-14 DIAGNOSIS — Z7902 Long term (current) use of antithrombotics/antiplatelets: Secondary | ICD-10-CM | POA: Insufficient documentation

## 2015-02-14 DIAGNOSIS — Z8611 Personal history of tuberculosis: Secondary | ICD-10-CM | POA: Insufficient documentation

## 2015-02-14 DIAGNOSIS — R197 Diarrhea, unspecified: Secondary | ICD-10-CM

## 2015-02-14 DIAGNOSIS — Z7982 Long term (current) use of aspirin: Secondary | ICD-10-CM | POA: Insufficient documentation

## 2015-02-14 DIAGNOSIS — I10 Essential (primary) hypertension: Secondary | ICD-10-CM | POA: Insufficient documentation

## 2015-02-14 DIAGNOSIS — E876 Hypokalemia: Secondary | ICD-10-CM

## 2015-02-14 DIAGNOSIS — Z8719 Personal history of other diseases of the digestive system: Secondary | ICD-10-CM | POA: Diagnosis not present

## 2015-02-14 DIAGNOSIS — E860 Dehydration: Secondary | ICD-10-CM

## 2015-02-14 LAB — URINE MICROSCOPIC-ADD ON

## 2015-02-14 LAB — CBC WITH DIFFERENTIAL/PLATELET
Basophils Absolute: 0 10*3/uL (ref 0.0–0.1)
Basophils Relative: 0 % (ref 0–1)
EOS ABS: 0.1 10*3/uL (ref 0.0–0.7)
EOS PCT: 2 % (ref 0–5)
HEMATOCRIT: 32.1 % — AB (ref 36.0–46.0)
HEMOGLOBIN: 10.7 g/dL — AB (ref 12.0–15.0)
Lymphocytes Relative: 16 % (ref 12–46)
Lymphs Abs: 0.8 10*3/uL (ref 0.7–4.0)
MCH: 29.6 pg (ref 26.0–34.0)
MCHC: 33.3 g/dL (ref 30.0–36.0)
MCV: 88.7 fL (ref 78.0–100.0)
MONO ABS: 0.7 10*3/uL (ref 0.1–1.0)
MONOS PCT: 14 % — AB (ref 3–12)
Neutro Abs: 3.5 10*3/uL (ref 1.7–7.7)
Neutrophils Relative %: 68 % (ref 43–77)
Platelets: 238 10*3/uL (ref 150–400)
RBC: 3.62 MIL/uL — ABNORMAL LOW (ref 3.87–5.11)
RDW: 12.9 % (ref 11.5–15.5)
WBC: 5.1 10*3/uL (ref 4.0–10.5)

## 2015-02-14 LAB — URINALYSIS, ROUTINE W REFLEX MICROSCOPIC
BILIRUBIN URINE: NEGATIVE
GLUCOSE, UA: NEGATIVE mg/dL
Ketones, ur: NEGATIVE mg/dL
Nitrite: POSITIVE — AB
PROTEIN: NEGATIVE mg/dL
Specific Gravity, Urine: <1.005 — ABNORMAL LOW (ref 1.005–1.030)
UROBILINOGEN UA: 0.2 mg/dL (ref 0.0–1.0)
pH: 6 (ref 5.0–8.0)

## 2015-02-14 LAB — COMPREHENSIVE METABOLIC PANEL
ALBUMIN: 4.1 g/dL (ref 3.5–5.0)
ALT: 17 U/L (ref 14–54)
ANION GAP: 11 (ref 5–15)
AST: 29 U/L (ref 15–41)
Alkaline Phosphatase: 47 U/L (ref 38–126)
BUN: 17 mg/dL (ref 6–20)
CALCIUM: 8.4 mg/dL — AB (ref 8.9–10.3)
CO2: 22 mmol/L (ref 22–32)
CREATININE: 0.94 mg/dL (ref 0.44–1.00)
Chloride: 95 mmol/L — ABNORMAL LOW (ref 101–111)
GFR calc Af Amer: >60 mL/min (ref >60)
GFR calc non Af Amer: 56 mL/min — ABNORMAL LOW (ref >60)
Glucose, Bld: 84 mg/dL (ref 65–99)
Potassium: 3.2 mmol/L — ABNORMAL LOW (ref 3.5–5.1)
Sodium: 128 mmol/L — ABNORMAL LOW (ref 135–145)
Total Bilirubin: 0.6 mg/dL (ref 0.3–1.2)
Total Protein: 7.7 g/dL (ref 6.5–8.1)

## 2015-02-14 MED ORDER — SODIUM CHLORIDE 0.9 % IV BOLUS (SEPSIS)
1000.0000 mL | Freq: Once | INTRAVENOUS | Status: AC
  Administered 2015-02-14: 1000 mL via INTRAVENOUS

## 2015-02-14 MED ORDER — CIPROFLOXACIN IN D5W 400 MG/200ML IV SOLN
400.0000 mg | Freq: Once | INTRAVENOUS | Status: AC
  Administered 2015-02-14: 400 mg via INTRAVENOUS
  Filled 2015-02-14: qty 200

## 2015-02-14 NOTE — ED Provider Notes (Signed)
CSN: [id number]     Arrival date & time 02/14/15  2016 History  This chart was scribed for Devoria Albe, MD by Octavia Heir, ED Scribe. This patient was seen in room APA10/APA10 and the patient's care was started at 11:28 PM.    Chief Complaint  Patient presents with  . Diarrhea      The history is provided by the patient. No language interpreter was used.   HPI Comments: Cheryl [name] is a 81 y.o. female who has a hx of diverticulitis presents to the Emergency Department complaining of intermittent, gradual worsening diarrhea onset 5 days ago, July 2. She had diarrhea that day, then none until the next evening when it became more profuse. Pt notes loss of appetite, dysuria, and generalized weakness from not eating. She notes not having any control over her bowels. She reports standing up and diarrhea still coming out. She states having about 12 bowel movements a day. They were loose but are now watery. Pt explains having similar symptoms years ago and states it was diagnosed as diverticulitis. At that time she did have mild pain.  She states she has been drinking a lot of water to try and stay hydrated. Pt denies abdominal pain, nausea, vomiting, being on antibiotics recently, dizziness, being around sick contacts or food exposure. Pt is a non-smoker. No new medications.   PCP: Dr. Juanetta Gosling  Past Medical History  Diagnosis Date  . Diverticulitis     complicated, with microperforation, sigmoid stricture  . Degenerative disc disease   . Tuberculosis   . Hypertension   . Anxiety   . Bulging discs    Past Surgical History  Procedure Laterality Date  . Colostomy  Dec 13, 2012    Dec 13, 2012 placed. Reversed   . Laparoscopy  May 2014  . Appendectomy    . Knee arthroscopy Right   . Coccyx removal    . Flexible sigmoidoscopy  December 08, 2012    Dr. Kerby Less in [place]: very edematous sigmoid colon, unable to advance scope past 25-30 cm despite attempt with peds scope.   . Colonoscopy   June 2014    Dr. Aliene Beams in [place]: via colostomy and rectum. scattered diverticula. no obvious polyps. Hemorrhoids noted in anal canal. Exam limited due to poor prep  . Laparoscopic low anterior resection  summer 2014    with reversal of colostomy  . Esophagogastroduodenoscopy N/A 07/01/2013    Procedure: ESOPHAGOGASTRODUODENOSCOPY (EGD);  Surgeon: West Bali, MD;  Location: [facility];  Service: Endoscopy;  Laterality: N/A;  3:00PM   Family History  Problem Relation Age of Onset  . Heart disease Mother   . Stroke Father   . Heart disease Sister   . Colon cancer Neg Hx    History  Substance Use Topics  . Smoking status: Never Smoker   . Smokeless tobacco: Never Used  . Alcohol Use: Yes     Comment: occasional   Visiting from [facility] since May Lives alone  OB History    No data available     Review of Systems  Constitutional: Positive for appetite change.  Gastrointestinal: Positive for diarrhea. Negative for nausea, vomiting and abdominal pain.  Genitourinary: Positive for dysuria.  Neurological: Positive for dizziness.  All other systems reviewed and are negative.     Allergies  Penicillins  Home Medications   Prior to Admission medications   Medication Sig Start Date End Date Taking? Authorizing Provider  alendronate (FOSAMAX) 70 MG  tablet Take 70 mg by mouth once a week. Takes on Sunday 06/04/13  Yes Historical Provider, MD  aspirin 325 MG tablet Take 325 mg by mouth daily.   Yes Historical Provider, MD  cholecalciferol (VITAMIN D) 1000 UNITS tablet Take 1,000 Units by mouth daily.   Yes Historical Provider, MD  clopidogrel (PLAVIX) 75 MG tablet Take 75 mg by mouth daily.  06/02/13  Yes Historical Provider, MD  ferrous sulfate 325 (65 FE) MG tablet Take 325 mg by mouth daily with breakfast.   Yes Historical Provider, MD  indapamide (LOZOL) 1.25 MG tablet Take 1.25 mg by mouth daily.  06/02/13  Yes Historical Provider, MD  levothyroxine (SYNTHROID,  LEVOTHROID) 50 MCG tablet Take 50 mcg by mouth daily before breakfast.  05/31/13  Yes Historical Provider, MD  pantoprazole (PROTONIX) 40 MG tablet Take 40 mg by mouth daily.   Yes Historical Provider, MD  potassium chloride (MICRO-K) 10 MEQ CR capsule Take 10 mEq by mouth 2 (two) times daily.  05/31/13  Yes Historical Provider, MD  verapamil (CALAN-SR) 180 MG CR tablet Take 180 mg by mouth 2 (two) times daily.  05/31/13  Yes Historical Provider, MD  vitamin C (ASCORBIC ACID) 500 MG tablet Take 500 mg by mouth daily.   Yes Historical Provider, MD  ciprofloxacin (CIPRO) 500 MG tablet Take 1 tablet (500 mg total) by mouth 2 (two) times daily. 02/15/15   Devoria Albe, MD  potassium chloride SA (K-DUR,KLOR-CON) 20 MEQ tablet Take 1 tablet (20 mEq total) by mouth 2 (two) times daily. 02/15/15   Devoria Albe, MD   Triage vitals: BP 144/61 mmHg  Pulse 70  Temp(Src) 98.4 F (36.9 C) (Oral)  Resp 16  Ht 5\' 1"  (1.549 m)  Wt 134 lb (60.782 kg)  BMI 25.33 kg/m2  SpO2 100%  Vital signs normal   Physical Exam  Constitutional: She is oriented to person, place, and time. She appears well-developed and well-nourished.  Non-toxic appearance. She does not appear ill. No distress.  HENT:  Head: Normocephalic and atraumatic.  Right Ear: External ear normal.  Left Ear: External ear normal.  Nose: Nose normal. No mucosal edema or rhinorrhea.  Mouth/Throat: Oropharynx is clear and moist and mucous membranes are normal. No dental abscesses or uvula swelling.  Tongue was mildly dry  Eyes: Conjunctivae and EOM are normal. Pupils are equal, round, and reactive to light.  Neck: Normal range of motion and full passive range of motion without pain. Neck supple.  Cardiovascular: Normal rate, regular rhythm and normal heart sounds.  Exam reveals no gallop and no friction rub.   No murmur heard. Pulmonary/Chest: Effort normal and breath sounds normal. No respiratory distress. She has no wheezes. She has no rhonchi. She has no  rales. She exhibits no tenderness and no crepitus.  Abdominal: Soft. Normal appearance and bowel sounds are normal. She exhibits no distension. There is no tenderness. There is no rebound and no guarding.  Musculoskeletal: Normal range of motion. She exhibits no edema or tenderness.  Moves all extremities well.   Neurological: She is alert and oriented to person, place, and time. She has normal strength. No cranial nerve deficit.  Skin: Skin is warm, dry and intact. No rash noted. No erythema. No pallor.  Psychiatric: She has a normal mood and affect. Her speech is normal and behavior is normal. Her mood appears not anxious.  Nursing note and vitals reviewed.   ED Course  Procedures   Medications  potassium chloride SA (K-DUR,KLOR-CON)  CR tablet 40 mEq (not administered)  sodium chloride 0.9 % bolus 1,000 mL (0 mLs Intravenous Stopped 02/15/15 0203)  ciprofloxacin (CIPRO) IVPB 400 mg (0 mg Intravenous Stopped 02/15/15 0117)    DIAGNOSTIC STUDIES: Oxygen Saturation is 100% on RA, normal by my interpretation.  COORDINATION OF CARE:  11:34 PM Discussed treatment plan which includes IV fluids and antibiotics with pt at bedside and pt agreed to plan.  After review of patient's labs she was given IV Cipro for her UTI. Patient states she has been having some burning and some frequency the last couple days which she attributed to the diarrhea.  Patient received IV fluids and felt improved. Patient did have one episode of diarrhea in the ED and sample was sent to lab.  At 2 AM patient was given the option of staying another hour to get her stool sample results or going home and following up with Dr. Juanetta Gosling office today. She prefers to go home now and she will follow-up later today with Dr. Juanetta Gosling office to get the stool results.  Labs Review Results for orders placed or performed during the hospital encounter of 02/14/15  CBC with Differential  Result Value Ref Range   WBC 5.1 4.0 - 10.5 K/uL    RBC 3.62 (L) 3.87 - 5.11 MIL/uL   Hemoglobin 10.7 (L) 12.0 - 15.0 g/dL   HCT 16.1 (L) 09.6 - 04.5 %   MCV 88.7 78.0 - 100.0 fL   MCH 29.6 26.0 - 34.0 pg   MCHC 33.3 30.0 - 36.0 g/dL   RDW 40.9 81.1 - 91.4 %   Platelets 238 150 - 400 K/uL   Neutrophils Relative % 68 43 - 77 %   Neutro Abs 3.5 1.7 - 7.7 K/uL   Lymphocytes Relative 16 12 - 46 %   Lymphs Abs 0.8 0.7 - 4.0 K/uL   Monocytes Relative 14 (H) 3 - 12 %   Monocytes Absolute 0.7 0.1 - 1.0 K/uL   Eosinophils Relative 2 0 - 5 %   Eosinophils Absolute 0.1 0.0 - 0.7 K/uL   Basophils Relative 0 0 - 1 %   Basophils Absolute 0.0 0.0 - 0.1 K/uL  Comprehensive metabolic panel  Result Value Ref Range   Sodium 128 (L) 135 - 145 mmol/L   Potassium 3.2 (L) 3.5 - 5.1 mmol/L   Chloride 95 (L) 101 - 111 mmol/L   CO2 22 22 - 32 mmol/L   Glucose, Bld 84 65 - 99 mg/dL   BUN 17 6 - 20 mg/dL   Creatinine, Ser 7.82 0.44 - 1.00 mg/dL   Calcium 8.4 (L) 8.9 - 10.3 mg/dL   Total Protein 7.7 6.5 - 8.1 g/dL   Albumin 4.1 3.5 - 5.0 g/dL   AST 29 15 - 41 U/L   ALT 17 14 - 54 U/L   Alkaline Phosphatase 47 38 - 126 U/L   Total Bilirubin 0.6 0.3 - 1.2 mg/dL   GFR calc non Af Amer 56 (L) >60 mL/min   GFR calc Af Amer >60 >60 mL/min   Anion gap 11 5 - 15  Urinalysis, Routine w reflex microscopic (not at [facility])  Result Value Ref Range   Color, Urine YELLOW YELLOW   APPearance HAZY (A) CLEAR   Specific Gravity, Urine <1.005 (L) 1.005 - 1.030   pH 6.0 5.0 - 8.0   Glucose, UA NEGATIVE NEGATIVE mg/dL   Hgb urine dipstick SMALL (A) NEGATIVE   Bilirubin Urine NEGATIVE NEGATIVE   Ketones, ur  NEGATIVE NEGATIVE mg/dL   Protein, ur NEGATIVE NEGATIVE mg/dL   Urobilinogen, UA 0.2 0.0 - 1.0 mg/dL   Nitrite POSITIVE (A) NEGATIVE   Leukocytes, UA LARGE (A) NEGATIVE  Urine microscopic-add on  Result Value Ref Range   Squamous Epithelial / LPF FEW (A) RARE   WBC, UA TOO NUMEROUS TO COUNT <3 WBC/hpf   RBC / HPF 3-6 <3 RBC/hpf   Bacteria, UA MANY (A) RARE    Laboratory interpretation all normal except except for UTI, hyponatremia, hypokalemia and low chloride all c/w dehydration, mild anemia     Imaging Review No results found.   EKG Interpretation None      MDM   Final diagnoses:  Diarrhea  Dehydration  Hypokalemia    New Prescriptions   CIPROFLOXACIN (CIPRO) 500 MG TABLET    Take 1 tablet (500 mg total) by mouth 2 (two) times daily.   POTASSIUM CHLORIDE SA (K-DUR,KLOR-CON) 20 MEQ TABLET    Take 1 tablet (20 mEq total) by mouth 2 (two) times daily.    Plan discharge  Devoria Albe, MD, FACEP   I personally performed the services described in this documentation, which was scribed in my presence. The recorded information has been reviewed and considered.  Devoria Albe, MD, Concha Pyo, MD 02/15/15 [phone]

## 2015-02-14 NOTE — ED Notes (Signed)
Dr.Knapp at bedside  

## 2015-02-14 NOTE — ED Notes (Signed)
Pt c/o diarrhea x 4 days and c/o no appetite.

## 2015-02-15 LAB — CLOSTRIDIUM DIFFICILE BY PCR: Toxigenic C. Difficile by PCR: NEGATIVE

## 2015-02-15 MED ORDER — CIPROFLOXACIN HCL 500 MG PO TABS: 500.0000 mg | ORAL_TABLET | Freq: Two times a day (BID) | ORAL | Status: DC

## 2015-02-15 MED ORDER — POTASSIUM CHLORIDE CRYS ER 20 MEQ PO TBCR: 20.0000 meq | EXTENDED_RELEASE_TABLET | Freq: Two times a day (BID) | ORAL | Status: DC

## 2015-02-15 MED ORDER — POTASSIUM CHLORIDE CRYS ER 20 MEQ PO TBCR
40.0000 meq | EXTENDED_RELEASE_TABLET | Freq: Once | ORAL | Status: AC
  Administered 2015-02-15: 40 meq via ORAL
  Filled 2015-02-15: qty 2

## 2015-02-15 NOTE — Discharge Instructions (Signed)
Drink plenty of fluids, avoid milk, it will make the diarrhea last longer. You can try imodium OTC for the diarrhea. You can also eat yogurt with live bacteria which will help get the "good" bacteria back into your GI tract. Take the antibiotics until gone. Call Dr Juanetta Gosling office later today to get the results of your stool tests. Return to the ED if you feel worse again.    Hypokalemia Hypokalemia means that the amount of potassium in the blood is lower than normal.Potassium is a chemical, called an electrolyte, that helps regulate the amount of fluid in the body. It also stimulates muscle contraction and helps nerves function properly.Most of the body's potassium is inside of cells, and only a very small amount is in the blood. Because the amount in the blood is so small, minor changes can be life-threatening. CAUSES  Antibiotics.  Diarrhea or vomiting.  Using laxatives too much, which can cause diarrhea.  Chronic kidney disease.  Water pills (diuretics).  Eating disorders (bulimia).  Low magnesium level.  Sweating a lot. SIGNS AND SYMPTOMS  Weakness.  Constipation.  Fatigue.  Muscle cramps.  Mental confusion.  Skipped heartbeats or irregular heartbeat (palpitations).  Tingling or numbness. DIAGNOSIS  Your health care provider can diagnose hypokalemia with blood tests. In addition to checking your potassium level, your health care provider may also check other lab tests. TREATMENT Hypokalemia can be treated with potassium supplements taken by mouth or adjustments in your current medicines. If your potassium level is very low, you may need to get potassium through a vein (IV) and be monitored in the hospital. A diet high in potassium is also helpful. Foods high in potassium are:  Nuts, such as peanuts and pistachios.  Seeds, such as sunflower seeds and pumpkin seeds.  Peas, lentils, and lima beans.  Whole grain and bran cereals and breads.  Fresh fruit and  vegetables, such as apricots, avocado, bananas, cantaloupe, kiwi, oranges, tomatoes, asparagus, and potatoes.  Orange and tomato juices.  Red meats.  Fruit yogurt. HOME CARE INSTRUCTIONS  Take all medicines as prescribed by your health care provider.  Maintain a healthy diet by including nutritious food, such as fruits, vegetables, nuts, whole grains, and lean meats.  If you are taking a laxative, be sure to follow the directions on the label. SEEK MEDICAL CARE IF:  Your weakness gets worse.  You feel your heart pounding or racing.  You are vomiting or having diarrhea.  You are diabetic and having trouble keeping your blood glucose in the normal range. SEEK IMMEDIATE MEDICAL CARE IF:  You have chest pain, shortness of breath, or dizziness.  You are vomiting or having diarrhea for more than 2 days.  You faint. MAKE SURE YOU:   Understand these instructions.  Will watch your condition.  Will get help right away if you are not doing well or get worse. Document Released: 07/28/2005 Document Revised: 05/18/2013 Document Reviewed: 01/28/2013 [facility] Patient Information 2015 [place], [place]. This information is not intended to replace advice given to you by your health care provider. Make sure you discuss any questions you have with your health care provider.  Food Choices to Help Relieve Diarrhea When you have diarrhea, the foods you eat and your eating habits are very important. Choosing the right foods and drinks can help relieve diarrhea. Also, because diarrhea can last up to 7 days, you need to replace lost fluids and electrolytes (such as sodium, potassium, and chloride) in order to help prevent dehydration.  WHAT GENERAL GUIDELINES DO I NEED TO FOLLOW?  Slowly drink 1 cup (8 oz) of fluid for each episode of diarrhea. If you are getting enough fluid, your urine will be clear or pale yellow.  Eat starchy foods. Some good choices include white rice, white toast, pasta,  low-fiber cereal, baked potatoes (without the skin), saltine crackers, and bagels.  Avoid large servings of any cooked vegetables.  Limit fruit to two servings per day. A serving is  cup or 1 small piece.  Choose foods with less than 2 g of fiber per serving.  Limit fats to less than 8 tsp (38 g) per day.  Avoid fried foods.  Eat foods that have probiotics in them. Probiotics can be found in certain dairy products.  Avoid foods and beverages that may increase the speed at which food moves through the stomach and intestines (gastrointestinal tract). Things to avoid include:  High-fiber foods, such as dried fruit, raw fruits and vegetables, nuts, seeds, and whole grain foods.  Spicy foods and high-fat foods.  Foods and beverages sweetened with high-fructose corn syrup, honey, or sugar alcohols such as xylitol, sorbitol, and mannitol. WHAT FOODS ARE RECOMMENDED? Grains White rice. White, [place], or pita breads (fresh or toasted), including plain rolls, buns, or bagels. White pasta. Saltine, soda, or graham crackers. Pretzels. Low-fiber cereal. Cooked cereals made with water (such as cornmeal, farina, or cream cereals). Plain muffins. Matzo. Melba toast. Zwieback.  Vegetables Potatoes (without the skin). Strained tomato and vegetable juices. Most well-cooked and canned vegetables without seeds. Tender lettuce. Fruits Cooked or canned applesauce, apricots, cherries, fruit cocktail, grapefruit, peaches, pears, or plums. Fresh bananas, apples without skin, cherries, grapes, cantaloupe, grapefruit, peaches, oranges, or plums.  Meat and Other Protein Products Baked or boiled chicken. Eggs. Tofu. Fish. Seafood. Smooth peanut butter. Ground or well-cooked tender beef, ham, veal, lamb, pork, or poultry.  Dairy Plain yogurt, kefir, and unsweetened liquid yogurt. Lactose-free milk, buttermilk, or soy milk. Plain hard cheese. Beverages Sport drinks. Clear broths. Diluted fruit juices (except  prune). Regular, caffeine-free sodas such as ginger ale. Water. Decaffeinated teas. Oral rehydration solutions. Sugar-free beverages not sweetened with sugar alcohols. Other Bouillon, broth, or soups made from recommended foods.  The items listed above may not be a complete list of recommended foods or beverages. Contact your dietitian for more options. WHAT FOODS ARE NOT RECOMMENDED? Grains Whole grain, whole wheat, bran, or rye breads, rolls, pastas, crackers, and cereals. Wild or brown rice. Cereals that contain more than 2 g of fiber per serving. Corn tortillas or taco shells. Cooked or dry oatmeal. Granola. Popcorn. Vegetables Raw vegetables. Cabbage, broccoli, Brussels sprouts, artichokes, baked beans, beet greens, corn, kale, legumes, peas, sweet potatoes, and yams. Potato skins. Cooked spinach and cabbage. Fruits Dried fruit, including raisins and dates. Raw fruits. Stewed or dried prunes. Fresh apples with skin, apricots, mangoes, pears, raspberries, and strawberries.  Meat and Other Protein Products Chunky peanut butter. Nuts and seeds. Beans and lentils. Tomasa Blase.  Dairy High-fat cheeses. Milk, chocolate milk, and beverages made with milk, such as milk shakes. Cream. Ice cream. Sweets and Desserts Sweet rolls, doughnuts, and sweet breads. Pancakes and waffles. Fats and Oils Butter. Cream sauces. Margarine. Salad oils. Plain salad dressings. Olives. Avocados.  Beverages Caffeinated beverages (such as coffee, tea, soda, or energy drinks). Alcoholic beverages. Fruit juices with pulp. Prune juice. Soft drinks sweetened with high-fructose corn syrup or sugar alcohols. Other Coconut. Hot sauce. Chili powder. Mayonnaise. Gravy. Cream-based or milk-based soups.  The  items listed above may not be a complete list of foods and beverages to avoid. Contact your dietitian for more information. WHAT SHOULD I DO IF I BECOME DEHYDRATED? Diarrhea can sometimes lead to dehydration. Signs of dehydration  include dark urine and dry mouth and skin. If you think you are dehydrated, you should rehydrate with an oral rehydration solution. These solutions can be purchased at pharmacies, retail stores, or online.  Drink -1 cup (120-240 mL) of oral rehydration solution each time you have an episode of diarrhea. If drinking this amount makes your diarrhea worse, try drinking smaller amounts more often. For example, drink 1-3 tsp (5-15 mL) every 5-10 minutes.  A general rule for staying hydrated is to drink 1-2 L of fluid per day. Talk to your health care provider about the specific amount you should be drinking each day. Drink enough fluids to keep your urine clear or pale yellow. Document Released: 10/18/2003 Document Revised: 08/02/2013 Document Reviewed: 06/20/2013 [facility] Patient Information 2015 [place], [place]. This information is not intended to replace advice given to you by your health care provider. Make sure you discuss any questions you have with your health care provider.

## 2015-02-15 NOTE — ED Notes (Signed)
Pt had one episode of diarrhea, stool sample collected, sent to lab,

## 2015-02-17 LAB — GI PATHOGEN PANEL BY PCR, STOOL
C DIFFICILE TOXIN A/B: NOT DETECTED
CRYPTOSPORIDIUM BY PCR: NOT DETECTED
Campylobacter by PCR: NOT DETECTED
E COLI 0157 BY PCR: NOT DETECTED
E coli (ETEC) LT/ST: NOT DETECTED
E coli (STEC): NOT DETECTED
G LAMBLIA BY PCR: NOT DETECTED
Rotavirus A by PCR: NOT DETECTED
SALMONELLA BY PCR: NOT DETECTED
Shigella by PCR: NOT DETECTED

## 2015-02-19 LAB — URINE CULTURE: SPECIAL REQUESTS: NORMAL

## 2015-02-20 ENCOUNTER — Telehealth (HOSPITAL_COMMUNITY): Payer: Self-pay

## 2015-02-20 NOTE — Telephone Encounter (Signed)
Post ED Visit - Positive Culture Follow-up  Culture report reviewed by antimicrobial stewardship pharmacist: []  Wes Dulaney, Pharm.D., BCPS [x]  Celedonio Miyamoto, Pharm.D., BCPS []  Georgina Pillion, Pharm.D., BCPS []  [place], [address].D., BCPS, AAHIVP []  Estella Husk, Pharm.D., BCPS, AAHIVP []  Elder Cyphers, [address].D., BCPS  Positive urine culture Treated with cipro, organism sensitive to the same and no further patient follow-up is required at this time.  Ashley Jacobs 02/20/2015, 1:01 PM

## 2015-02-21 DIAGNOSIS — I10 Essential (primary) hypertension: Secondary | ICD-10-CM | POA: Diagnosis not present

## 2015-02-21 DIAGNOSIS — G459 Transient cerebral ischemic attack, unspecified: Secondary | ICD-10-CM | POA: Diagnosis not present

## 2015-02-21 DIAGNOSIS — E785 Hyperlipidemia, unspecified: Secondary | ICD-10-CM | POA: Diagnosis not present

## 2015-02-21 DIAGNOSIS — A0811 Acute gastroenteropathy due to Norwalk agent: Secondary | ICD-10-CM | POA: Diagnosis not present

## 2015-02-22 DIAGNOSIS — A0811 Acute gastroenteropathy due to Norwalk agent: Secondary | ICD-10-CM | POA: Diagnosis not present

## 2015-02-22 DIAGNOSIS — G459 Transient cerebral ischemic attack, unspecified: Secondary | ICD-10-CM | POA: Diagnosis not present

## 2015-02-22 DIAGNOSIS — E785 Hyperlipidemia, unspecified: Secondary | ICD-10-CM | POA: Diagnosis not present

## 2015-02-22 DIAGNOSIS — I10 Essential (primary) hypertension: Secondary | ICD-10-CM | POA: Diagnosis not present

## 2015-02-22 DIAGNOSIS — E876 Hypokalemia: Secondary | ICD-10-CM | POA: Diagnosis not present

## 2015-02-22 DIAGNOSIS — M545 Low back pain: Secondary | ICD-10-CM | POA: Diagnosis not present

## 2015-05-24 DIAGNOSIS — Z23 Encounter for immunization: Secondary | ICD-10-CM | POA: Diagnosis not present

## 2015-05-24 DIAGNOSIS — M545 Low back pain: Secondary | ICD-10-CM | POA: Diagnosis not present

## 2015-05-24 DIAGNOSIS — G459 Transient cerebral ischemic attack, unspecified: Secondary | ICD-10-CM | POA: Diagnosis not present

## 2015-05-24 DIAGNOSIS — K5901 Slow transit constipation: Secondary | ICD-10-CM | POA: Diagnosis not present

## 2015-05-24 DIAGNOSIS — I10 Essential (primary) hypertension: Secondary | ICD-10-CM | POA: Diagnosis not present

## 2015-07-31 DIAGNOSIS — R7309 Other abnormal glucose: Secondary | ICD-10-CM | POA: Diagnosis not present

## 2015-07-31 DIAGNOSIS — E7800 Pure hypercholesterolemia, unspecified: Secondary | ICD-10-CM | POA: Diagnosis not present

## 2015-07-31 DIAGNOSIS — I10 Essential (primary) hypertension: Secondary | ICD-10-CM | POA: Diagnosis not present

## 2015-07-31 DIAGNOSIS — E031 Congenital hypothyroidism without goiter: Secondary | ICD-10-CM | POA: Diagnosis not present

## 2015-08-02 DIAGNOSIS — E039 Hypothyroidism, unspecified: Secondary | ICD-10-CM | POA: Diagnosis not present

## 2015-08-02 DIAGNOSIS — I10 Essential (primary) hypertension: Secondary | ICD-10-CM | POA: Diagnosis not present

## 2015-08-02 DIAGNOSIS — I2510 Atherosclerotic heart disease of native coronary artery without angina pectoris: Secondary | ICD-10-CM | POA: Diagnosis not present

## 2015-08-02 DIAGNOSIS — M545 Low back pain: Secondary | ICD-10-CM | POA: Diagnosis not present

## 2015-08-02 DIAGNOSIS — Z0000 Encounter for general adult medical examination without abnormal findings: Secondary | ICD-10-CM | POA: Diagnosis not present

## 2015-09-03 DIAGNOSIS — M1711 Unilateral primary osteoarthritis, right knee: Secondary | ICD-10-CM | POA: Diagnosis not present

## 2015-09-03 DIAGNOSIS — Z6826 Body mass index (BMI) 26.0-26.9, adult: Secondary | ICD-10-CM | POA: Diagnosis not present

## 2015-10-16 DIAGNOSIS — M1711 Unilateral primary osteoarthritis, right knee: Secondary | ICD-10-CM | POA: Diagnosis not present

## 2015-10-16 DIAGNOSIS — M19072 Primary osteoarthritis, left ankle and foot: Secondary | ICD-10-CM | POA: Diagnosis not present

## 2015-10-16 DIAGNOSIS — M19071 Primary osteoarthritis, right ankle and foot: Secondary | ICD-10-CM | POA: Diagnosis not present

## 2015-10-16 DIAGNOSIS — Z6826 Body mass index (BMI) 26.0-26.9, adult: Secondary | ICD-10-CM | POA: Diagnosis not present

## 2015-11-19 DIAGNOSIS — E7800 Pure hypercholesterolemia, unspecified: Secondary | ICD-10-CM | POA: Diagnosis not present

## 2015-11-19 DIAGNOSIS — E039 Hypothyroidism, unspecified: Secondary | ICD-10-CM | POA: Diagnosis not present

## 2015-12-06 DIAGNOSIS — I2510 Atherosclerotic heart disease of native coronary artery without angina pectoris: Secondary | ICD-10-CM | POA: Diagnosis not present

## 2015-12-06 DIAGNOSIS — I10 Essential (primary) hypertension: Secondary | ICD-10-CM | POA: Diagnosis not present

## 2015-12-06 DIAGNOSIS — M545 Low back pain: Secondary | ICD-10-CM | POA: Diagnosis not present

## 2015-12-06 DIAGNOSIS — E039 Hypothyroidism, unspecified: Secondary | ICD-10-CM | POA: Diagnosis not present

## 2016-01-25 ENCOUNTER — Other Ambulatory Visit (HOSPITAL_COMMUNITY): Payer: Self-pay | Admitting: Pulmonary Disease

## 2016-01-25 DIAGNOSIS — Z1231 Encounter for screening mammogram for malignant neoplasm of breast: Secondary | ICD-10-CM

## 2016-01-28 ENCOUNTER — Ambulatory Visit (HOSPITAL_COMMUNITY)
Admission: RE | Admit: 2016-01-28 | Discharge: 2016-01-28 | Disposition: A | Discharge status: Home / Self Care | Payer: Medicare Other | Source: Ambulatory Visit | Attending: Pulmonary Disease | Admitting: Pulmonary Disease

## 2016-01-28 DIAGNOSIS — Z1231 Encounter for screening mammogram for malignant neoplasm of breast: Secondary | ICD-10-CM | POA: Insufficient documentation

## 2016-02-08 ENCOUNTER — Encounter: Payer: Self-pay | Admitting: Obstetrics and Gynecology

## 2016-02-08 ENCOUNTER — Ambulatory Visit (INDEPENDENT_AMBULATORY_CARE_PROVIDER_SITE_OTHER): Payer: Medicare Other | Admitting: Obstetrics and Gynecology

## 2016-02-08 VITALS — BP 130/72 | Ht 60.0 in | Wt 137.0 lb

## 2016-02-08 DIAGNOSIS — Z124 Encounter for screening for malignant neoplasm of cervix: Secondary | ICD-10-CM

## 2016-02-08 DIAGNOSIS — Z01419 Encounter for gynecological examination (general) (routine) without abnormal findings: Secondary | ICD-10-CM

## 2016-02-08 DIAGNOSIS — K649 Unspecified hemorrhoids: Secondary | ICD-10-CM

## 2016-02-08 NOTE — Progress Notes (Signed)
Assessment:  Annual Gyn Exam Cystocele Bleeding felt not of GYN origin, probably hemorrhoid   Plan:  1. pap smear done, next pap due 3 years 2. return annually or prn 3    Annual mammogram advised Subjective:  Cheryl [name] is a 82 y.o. female No obstetric history on file. who presents for annual exam. No LMP recorded. Patient is postmenopausal. The patient has complaints today to rule out vaginal bleeding onset last night. Pt states that she noticed some blood in the toilet bowl, pt is unsure of the color of the blood. Pt notes that she had a surgery several years ago with resection of her colon due to diverticulitis She has Hemorrhoids. Pt reports that she last had a mammogram in June. Pt notes that her bladder "falls out" and she intermittently has to push it back in. Pt states that she used to use a pessary, but doesn't anymore. Pt has not tried any medications for the relief of her symptoms. Pt denies any other symptoms. Pt states that she is still currently working at this time. Pt notes that she still exercises as well.   The following portions of the patient's history were reviewed and updated as appropriate: allergies, current medications, past family history, past medical history, past social history, past surgical history and problem list. Past Medical History  Diagnosis Date  . Diverticulitis     complicated, with microperforation, sigmoid stricture  . Degenerative disc disease   . Tuberculosis   . Hypertension   . Anxiety   . Bulging discs     Past Surgical History  Procedure Laterality Date  . Colostomy  Dec 13, 2012    Dec 13, 2012 placed. Reversed   . Laparoscopy  May 2014  . Appendectomy    . Knee arthroscopy Right   . Coccyx removal    . Flexible sigmoidoscopy  December 08, 2012    Dr. Kerby Less in [place]: very edematous sigmoid colon, unable to advance scope past 25-30 cm despite attempt with peds scope.   . Colonoscopy  June 2014    Dr. Aliene Beams in  [place]: via colostomy and rectum. scattered diverticula. no obvious polyps. Hemorrhoids noted in anal canal. Exam limited due to poor prep  . Laparoscopic low anterior resection  summer 2014    with reversal of colostomy  . Esophagogastroduodenoscopy N/A 07/01/2013    Procedure: ESOPHAGOGASTRODUODENOSCOPY (EGD);  Surgeon: West Bali, MD;  [facility];  Service: Endoscopy;  Laterality: N/A;  3:00PM     Current outpatient prescriptions:  .  alendronate (FOSAMAX) 70 MG tablet, Take 70 mg by mouth once a week. Takes on Sunday, Disp: , Rfl:  .  aspirin 325 MG tablet, Take 325 mg by mouth daily., Disp: , Rfl:  .  cholecalciferol (VITAMIN D) 1000 UNITS tablet, Take 1,000 Units by mouth daily., Disp: , Rfl:  .  clopidogrel (PLAVIX) 75 MG tablet, Take 75 mg by mouth daily. , Disp: , Rfl:  .  ferrous sulfate 325 (65 FE) MG tablet, Take 325 mg by mouth daily with breakfast., Disp: , Rfl:  .  indapamide (LOZOL) 1.25 MG tablet, Take 1.25 mg by mouth daily. , Disp: , Rfl:  .  levothyroxine (SYNTHROID, LEVOTHROID) 50 MCG tablet, Take 50 mcg by mouth daily before breakfast. , Disp: , Rfl:  .  pantoprazole (PROTONIX) 40 MG tablet, Take 40 mg by mouth daily., Disp: , Rfl:  .  potassium chloride (MICRO-K) 10 MEQ CR capsule, Take 10  mEq by mouth 2 (two) times daily. , Disp: , Rfl:  .  verapamil (CALAN-SR) 180 MG CR tablet, Take 180 mg by mouth 2 (two) times daily. , Disp: , Rfl:  .  vitamin C (ASCORBIC ACID) 500 MG tablet, Take 500 mg by mouth daily., Disp: , Rfl:   Review of Systems Constitutional: negative Gastrointestinal: negative Genitourinary: vaginal bleeding  Objective:  BP 130/72 mmHg  Ht 5' (1.524 m)  Wt 137 lb (62.143 kg)  BMI 26.76 kg/m2   BMI: Body mass index is 26.76 kg/(m^2).  General Appearance: Alert, appropriate appearance for age. No acute distress HEENT: Grossly normal Neck / Thyroid:  Cardiovascular: RRR; normal S1, S2, no murmur Lungs: CTA bilaterally Back: No  CVAT Breast Exam: No masses or nodes.No dimpling, nipple retraction or discharge. Gastrointestinal: Soft, non-tender, no masses or organomegaly Pelvic Exam: Vulva and vagina appear normal. Bimanual exam reveals normal uterus and adnexa. Vaginal: normal mucosa without prolapse or lesions, normal without tenderness, induration or masses, normal rugae and cystocele present, good support in back Cervix: normal appearance Adnexa: normal bimanual exam Uterus: normal single, nontender Rectal: good sphincter tone, no masses and guaiac negative Rectovaginal: not indicated Lymphatic Exam: Non-palpable nodes in neck, clavicular, axillary, or inguinal regions  Skin: no rash or abnormalities Neurologic: Normal gait and speech, no tremor  Psychiatric: Alert and oriented, appropriate affect.  Urinalysis:Not done  Christin Bach. MD Pgr [phone] 12:37 PM    By signing my name below, I, Soijett Blue, attest that this documentation has been prepared under the direction and in the presence of Tilda Burrow, MD. Electronically Signed: Soijett Blue, ED Scribe. 02/08/2016. 12:38 PM.  I personally performed the services described in this documentation, which was SCRIBED in my presence. The recorded information has been reviewed and considered accurate. It has been edited as necessary during review. Tilda Burrow, MD

## 2016-02-08 NOTE — Progress Notes (Signed)
Patient ID: Cheryl [name], female   DOB: 1932/10/10, 82 y.o.   MRN: [id number] Pt here today for annual exam. Pt states that she had bowel surgery in 2013 and has noticed a red tinge to her stool recently, pt is a little concerned. Pt denies any other problems or concerns at this time.

## 2016-03-04 DIAGNOSIS — G459 Transient cerebral ischemic attack, unspecified: Secondary | ICD-10-CM | POA: Diagnosis not present

## 2016-03-04 DIAGNOSIS — I10 Essential (primary) hypertension: Secondary | ICD-10-CM | POA: Diagnosis not present

## 2016-03-04 DIAGNOSIS — K210 Gastro-esophageal reflux disease with esophagitis: Secondary | ICD-10-CM | POA: Diagnosis not present

## 2016-03-04 DIAGNOSIS — E785 Hyperlipidemia, unspecified: Secondary | ICD-10-CM | POA: Diagnosis not present

## 2016-04-25 DIAGNOSIS — I10 Essential (primary) hypertension: Secondary | ICD-10-CM | POA: Diagnosis not present

## 2016-04-25 DIAGNOSIS — R079 Chest pain, unspecified: Secondary | ICD-10-CM | POA: Diagnosis not present

## 2016-04-25 DIAGNOSIS — Z23 Encounter for immunization: Secondary | ICD-10-CM | POA: Diagnosis not present

## 2016-04-28 ENCOUNTER — Other Ambulatory Visit (HOSPITAL_COMMUNITY): Payer: Self-pay | Admitting: Pulmonary Disease

## 2016-04-28 ENCOUNTER — Ambulatory Visit (HOSPITAL_COMMUNITY)
Admission: RE | Admit: 2016-04-28 | Discharge: 2016-04-28 | Disposition: A | Discharge status: Home / Self Care | Payer: Medicare Other | Source: Ambulatory Visit | Attending: Pulmonary Disease | Admitting: Pulmonary Disease

## 2016-04-28 DIAGNOSIS — R079 Chest pain, unspecified: Secondary | ICD-10-CM | POA: Insufficient documentation

## 2016-05-22 NOTE — Progress Notes (Signed)
Cardiology Office Note   Date:  05/23/2016   ID:  Cheryl [name], DOB 1932/12/04, MRN [id number]  PCP:  Fredirick MaudlinHAWKINS,EDWARD L, MD  Cardiologist:   Charlton HawsPeter Lazarius Rivkin, MD   No chief complaint on file.     History of Present Illness: Cheryl [name] is a 82 y.o. female who presents for evaluation of chest pain and abnormal ECG .  04/25/16 seen by Dr Juanetta GoslingHawkins And complained about sharp pain on left side of chest . Lasted 15 minutes Associated with excess belching.  Has a history of GERD And reflux as well not on RX  Resolved spontaneously CRF;s include HTN, elevated lipids, hyperlipidemia, TIA and advanced age  Given script for SL nitro and protonix and referred to cardiology   Has not had issues since seeing primary. 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 Widowed 16 years [place].  Has had partial bowel resection and lots of abdominal pains  Past Medical History:  Diagnosis Date  . Anxiety   . Bulging discs   . Degenerative disc disease   . Diverticulitis    complicated, with microperforation, sigmoid stricture  . Hypertension   . Tuberculosis     Past Surgical History:  Procedure Laterality Date  . APPENDECTOMY    . COCCYX REMOVAL    . COLONOSCOPY  June 2014   Dr. [place]rey Kaner [place] colostomy and rectum. scattered diverticula. no obvious polyps. Hemorrhoids noted in anal canal. Exam limited due to poor prep  . COLOSTOMY  Dec 13, 2012   Dec 13, 2012 placed. Reversed   . ESOPHAGOGASTRODUODENOSCOPY N/A 07/01/2013   Procedure: ESOPHAGOGASTRODUODENOSCOPY (EGD);  Surgeon: West BaliSandi L Fields, MD;  Location: [facility];  Service: Endoscopy;  Laterality: N/A;  3:00PM  . FLEXIBLE SIGMOIDOSCOPY  December 08, 2012   Dr. [place]Enrique Molina in [place]: very edematous sigmoid colon, unable to advance scope past 25-30 cm despite attempt with peds scope.   Marland Kitchen. KNEE ARTHROSCOPY Right   . LAPAROSCOPIC LOW ANTERIOR RESECTION  summer 2014   with reversal of  colostomy  . LAPAROSCOPY  May 2014     Current Outpatient Prescriptions  Medication Sig Dispense Refill  . alendronate (FOSAMAX) 70 MG tablet Take 70 mg by mouth once a week. Takes on Sunday    . aspirin 325 MG tablet Take 325 mg by mouth daily.    . cholecalciferol (VITAMIN D) 1000 UNITS tablet Take 1,000 Units by mouth daily.    . clopidogrel (PLAVIX) 75 MG tablet Take 75 mg by mouth daily.     . ferrous sulfate 325 (65 FE) MG tablet Take 325 mg by mouth daily with breakfast.    . indapamide (LOZOL) 1.25 MG tablet Take 1.25 mg by mouth daily.     Marland Kitchen. levothyroxine (SYNTHROID, LEVOTHROID) 50 MCG tablet Take 50 mcg by mouth daily before breakfast.     . pantoprazole (PROTONIX) 40 MG tablet Take 40 mg by mouth daily.    . potassium chloride (MICRO-K) 10 MEQ CR capsule Take 10 mEq by mouth 2 (two) times daily.     . verapamil (CALAN-SR) 180 MG CR tablet Take 180 mg by mouth 2 (two) times daily.     . vitamin C (ASCORBIC ACID) 500 MG tablet Take 500 mg by mouth daily.     No current facility-administered medications for this visit.     Allergies:   Penicillins    Social History:  The patient  reports that she has  never smoked. She has never used smokeless tobacco. She reports that she drinks alcohol. She reports that she does not use drugs.   Family History:  The patient's family history includes Heart disease in her mother and sister; Stroke in her father.    ROS:  Please see the history of present illness.   Otherwise, review of systems are positive for none.   All other systems are reviewed and negative.    PHYSICAL EXAM: VS:  BP (!) 150/84   Pulse 85   Ht 5' (1.524 m)   Wt 60.3 kg (133 lb)   SpO2 99%   BMI 25.97 kg/m  , BMI Body mass index is 25.97 kg/m. Affect appropriate Healthy:  appears stated age HEENT: normal Neck supple with no adenopathy JVP normal no bruits no thyromegaly Lungs clear with no wheezing and good diaphragmatic motion Heart:  S1/S2 no murmur, no  rub, gallop or click PMI normal Abdomen: benighn, BS positve, no tenderness, no AAA no bruit.  No HSM or HJR Distal pulses intact with no bruits No edema Neuro non-focal Skin warm and dry No muscular weakness    EKG:   09/03/12  SR rate 73 nonspecific lateral T wave changes    Recent Labs: No results found for requested labs within last 8760 hours.    Lipid Panel No results found for: CHOL, TRIG, HDL, CHOLHDL, VLDL, LDLCALC, LDLDIRECT    Wt Readings from Last 3 Encounters:  05/23/16 60.3 kg (133 lb)  02/08/16 62.1 kg (137 lb)  02/14/15 60.8 kg (134 lb)      Other studies Reviewed: Additional studies/ records that were reviewed today include: Notes from Dr Juanetta Gosling , labs and ECG from [facility].    ASSESSMENT AND PLAN:  1.  Chest Pain atypical given age will order exercise myovue  2. GERD continue protonix likely etiology of pain 3. HTN: Well controlled.  Continue current medications and low sodium Dash type diet.   4. Chol: Cholesterol is at goal.  Continue current dose of statin and diet Rx.  No myalgias or side effects.  F/U  LFT's in 6 months. No results found for: LDLCALC          5. TIA no bruits continue ASA  6. Thyroid  Continue replacement labs with primary    Current medicines are reviewed at length with the patient today.  The patient does not have concerns regarding medicines.  The following changes have been made:  no change  Labs/ tests ordered today include: Lexiscan Myovue   Orders Placed This Encounter  Procedures  . NM Myocar Multi W/Spect W/Curlin Motion / EF     Disposition:   FU with cardiology PRN if myovue normal      Signed, Charlton Haws, MD  05/23/2016 9:21 AM    [facility] [address] [place], [place], [place]  [address] Phone: [phone]; Fax: [phone]

## 2016-05-23 ENCOUNTER — Encounter: Payer: Self-pay | Admitting: Cardiovascular Disease

## 2016-05-23 ENCOUNTER — Ambulatory Visit (INDEPENDENT_AMBULATORY_CARE_PROVIDER_SITE_OTHER): Payer: Medicare Other | Admitting: Cardiovascular Disease

## 2016-05-23 VITALS — BP 150/84 | HR 85 | Ht 60.0 in | Wt 133.0 lb

## 2016-05-23 DIAGNOSIS — R079 Chest pain, unspecified: Secondary | ICD-10-CM | POA: Diagnosis not present

## 2016-05-23 NOTE — Patient Instructions (Signed)
Your physician recommends that you continue on your current medications as directed. Please refer to the Current Medication list given to you today.   Your physician has requested that you have en exercise stress myoview. For further information please visit [email or web address] Please follow instruction sheet, as given.    Your physician recommends that you schedule a follow-up appointment in: as needed   Thanks for choosing [facility]!!!

## 2016-05-26 ENCOUNTER — Encounter (HOSPITAL_COMMUNITY): Payer: Self-pay

## 2016-05-26 ENCOUNTER — Encounter (HOSPITAL_COMMUNITY)
Admission: RE | Admit: 2016-05-26 | Discharge: 2016-05-26 | Disposition: A | Discharge status: Home / Self Care | Payer: Medicare Other | Source: Ambulatory Visit | Attending: Cardiovascular Disease | Admitting: Cardiovascular Disease

## 2016-05-26 ENCOUNTER — Inpatient Hospital Stay (HOSPITAL_COMMUNITY): Admission: RE | Admit: 2016-05-26 | Payer: Medicare Other | Source: Ambulatory Visit

## 2016-05-26 DIAGNOSIS — R079 Chest pain, unspecified: Secondary | ICD-10-CM | POA: Diagnosis not present

## 2016-05-26 LAB — NM MYOCAR MULTI W/SPECT W/WALL MOTION / EF
CHL CUP NUCLEAR SDS: 13
CHL CUP RESTING HR STRESS: 67 {beats}/min
LV dias vol: 151 mL (ref 46–106)
LVSYSVOL: 119 mL
Peak HR: 80 {beats}/min
RATE: 0
SRS: 18
SSS: 31
TID: 1.27

## 2016-05-26 MED ORDER — TECHNETIUM TC 99M TETROFOSMIN IV KIT
30.0000 | PACK | Freq: Once | INTRAVENOUS | Status: AC | PRN
  Administered 2016-05-26: 27 via INTRAVENOUS

## 2016-05-26 MED ORDER — TECHNETIUM TC 99M TETROFOSMIN IV KIT
10.0000 | PACK | Freq: Once | INTRAVENOUS | Status: AC | PRN
  Administered 2016-05-26: 10.45 via INTRAVENOUS

## 2016-05-26 MED ORDER — REGADENOSON 0.4 MG/5ML IV SOLN
INTRAVENOUS | Status: AC
  Administered 2016-05-26: 0.4 mg via INTRAVENOUS
  Filled 2016-05-26: qty 5

## 2016-05-26 MED ORDER — SODIUM CHLORIDE 0.9% FLUSH
INTRAVENOUS | Status: AC
  Administered 2016-05-26: 10 mL via INTRAVENOUS
  Filled 2016-05-26: qty 10

## 2016-05-27 ENCOUNTER — Telehealth: Payer: Self-pay

## 2016-05-27 DIAGNOSIS — Z79899 Other long term (current) drug therapy: Secondary | ICD-10-CM | POA: Diagnosis not present

## 2016-05-27 MED ORDER — LOSARTAN POTASSIUM 25 MG PO TABS: 25.0000 mg | ORAL_TABLET | Freq: Every day | ORAL | 3 refills | Status: DC

## 2016-05-27 MED ORDER — CARVEDILOL 3.125 MG PO TABS: 3.1250 mg | ORAL_TABLET | Freq: Two times a day (BID) | ORAL | 3 refills | Status: DC

## 2016-05-27 NOTE — Telephone Encounter (Signed)
Apt made 10/20 at 250 pm with K Lyman Bishop NP,pt is going to [place] on 11/22 and declines heart cath here. We will provide records to her for cardiologist she picks in [place].Awaiting clarification of coreg dose with Dr Eden Emms    Patient has asked [place] to speak with daughter,Cathy Simonne Come who is in [place]

## 2016-05-27 NOTE — Telephone Encounter (Signed)
-----   Message from Wendall Stade, MD sent at 05/27/2016  8:43 AM EDT ----- Myovue suggest large "old" MI EF 21% needs med change and then right and left heart cath. Stop verapamil and start cozaar 25 mg and coreg 6.125 bid F/U BMET / BNP 3 weeks F/U with MD/ NP in [place] 3-4 weeks to discuss cath

## 2016-05-27 NOTE — Addendum Note (Signed)
Addended by: Marlyn Corporal A on: 05/27/2016 12:36 PM   Modules accepted: Orders

## 2016-05-29 ENCOUNTER — Telehealth: Payer: Self-pay | Admitting: Cardiovascular Disease

## 2016-05-29 NOTE — Telephone Encounter (Signed)
Told pt she does not have to have to have lab slip, that she can go over to the [place] lab and the orders are already in computer.

## 2016-05-29 NOTE — Telephone Encounter (Signed)
Patient states that she spoke with [facility] and was supposed to be received lab order. States that she has not received one and is to go tomorrow. / tg

## 2016-05-30 ENCOUNTER — Telehealth: Payer: Self-pay

## 2016-05-30 DIAGNOSIS — Z79899 Other long term (current) drug therapy: Secondary | ICD-10-CM

## 2016-05-30 DIAGNOSIS — I10 Essential (primary) hypertension: Secondary | ICD-10-CM

## 2016-05-30 LAB — BASIC METABOLIC PANEL
BUN: 10 mg/dL (ref 7–25)
CHLORIDE: 98 mmol/L (ref 98–110)
CO2: 26 mmol/L (ref 20–31)
CREATININE: 0.77 mg/dL (ref 0.60–0.88)
Calcium: 9.7 mg/dL (ref 8.6–10.4)
Glucose, Bld: 83 mg/dL (ref 65–99)
Potassium: 4.8 mmol/L (ref 3.5–5.3)
SODIUM: 135 mmol/L (ref 135–146)

## 2016-05-30 LAB — BRAIN NATRIURETIC PEPTIDE: Brain Natriuretic Peptide: 1545.3 pg/mL — ABNORMAL HIGH (ref <100)

## 2016-05-30 MED ORDER — FUROSEMIDE 20 MG PO TABS: 20.0000 mg | ORAL_TABLET | Freq: Every day | ORAL | 3 refills | Status: DC

## 2016-05-30 NOTE — Telephone Encounter (Signed)
Pt aware of med changes,mailed lab slip

## 2016-05-30 NOTE — Telephone Encounter (Signed)
-----   Message from Wendall Stade, MD sent at 05/30/2016  2:16 PM EDT ----- K/Cr ok BNP elevated stop indapamide and start lasix 20 mg daily repeat labs in 3 weeks

## 2016-06-02 ENCOUNTER — Telehealth: Payer: Self-pay | Admitting: Adult Health

## 2016-06-02 DIAGNOSIS — R931 Abnormal findings on diagnostic imaging of heart and coronary circulation: Secondary | ICD-10-CM

## 2016-06-02 NOTE — Telephone Encounter (Signed)
Would like to speak with Samara Deist regarding patient's condition and test results. Caller is patient's daughter from [place]. / tg

## 2016-06-03 NOTE — Telephone Encounter (Signed)
Forward to Dr Eden Emms who saw pt

## 2016-06-03 NOTE — Telephone Encounter (Signed)
Please send to Dr. Eden Emms to address. I did not see this patient,.

## 2016-06-04 DIAGNOSIS — K210 Gastro-esophageal reflux disease with esophagitis: Secondary | ICD-10-CM | POA: Diagnosis not present

## 2016-06-04 DIAGNOSIS — Z23 Encounter for immunization: Secondary | ICD-10-CM | POA: Diagnosis not present

## 2016-06-04 DIAGNOSIS — I10 Essential (primary) hypertension: Secondary | ICD-10-CM | POA: Diagnosis not present

## 2016-06-04 DIAGNOSIS — I509 Heart failure, unspecified: Secondary | ICD-10-CM | POA: Diagnosis not present

## 2016-06-04 DIAGNOSIS — I2510 Atherosclerotic heart disease of native coronary artery without angina pectoris: Secondary | ICD-10-CM | POA: Diagnosis not present

## 2016-06-04 NOTE — Telephone Encounter (Signed)
Spoke with daughter Joen Laura do an echo this week or early next to confirm EF is low If it is she is willing to have cath here and can arrange with one of the interventionalists as I am gone for 10 days I think she has an appt with [facility] soon

## 2016-06-05 NOTE — Telephone Encounter (Signed)
Echo scheduled for 12 pm tomorrow  06/05/16,LM at pt's home number, I also spoke with daughter Olegario Messier Leo.ptcalled back and confirmed with Raechel Ache she will come for echo   Pt has apt at 250 pm also tomorrow with Harriet Pho NP

## 2016-06-05 NOTE — Telephone Encounter (Signed)
Will see NP tomorrow,will try to get echo in am

## 2016-06-05 NOTE — Addendum Note (Signed)
Addended by: Marlyn Corporal A on: 06/05/2016 03:23 PM   Modules accepted: Orders

## 2016-06-06 ENCOUNTER — Ambulatory Visit (INDEPENDENT_AMBULATORY_CARE_PROVIDER_SITE_OTHER): Payer: Medicare Other | Admitting: Adult Health

## 2016-06-06 ENCOUNTER — Encounter: Payer: Self-pay | Admitting: *Deleted

## 2016-06-06 ENCOUNTER — Encounter: Payer: Self-pay | Admitting: Adult Health

## 2016-06-06 ENCOUNTER — Ambulatory Visit (HOSPITAL_COMMUNITY)
Admission: RE | Admit: 2016-06-06 | Discharge: 2016-06-06 | Disposition: A | Discharge status: Home / Self Care | Payer: Medicare Other | Source: Ambulatory Visit | Attending: Cardiovascular Disease | Admitting: Cardiovascular Disease

## 2016-06-06 VITALS — BP 136/72 | HR 62 | Ht 60.0 in | Wt 132.0 lb

## 2016-06-06 DIAGNOSIS — I255 Ischemic cardiomyopathy: Secondary | ICD-10-CM | POA: Insufficient documentation

## 2016-06-06 DIAGNOSIS — I249 Acute ischemic heart disease, unspecified: Secondary | ICD-10-CM

## 2016-06-06 DIAGNOSIS — I340 Nonrheumatic mitral (valve) insufficiency: Secondary | ICD-10-CM | POA: Insufficient documentation

## 2016-06-06 DIAGNOSIS — R931 Abnormal findings on diagnostic imaging of heart and coronary circulation: Secondary | ICD-10-CM

## 2016-06-06 DIAGNOSIS — I200 Unstable angina: Secondary | ICD-10-CM

## 2016-06-06 NOTE — Progress Notes (Signed)
Name: Cheryl [name]    DOB: 1932/10/10  Age: 82 y.o.  MR#: [id number]       PCP:  Fredirick Maudlin, MD      Insurance: Payor: MEDICARE / Plan: MEDICARE PART A AND B / Product Type: *No Product type* /   CC:   No chief complaint on file.   VS Vitals:   06/06/16 1504  BP: 136/72  Pulse: 62  SpO2: 98%  Weight: 132 lb (59.9 kg)  Height: 5' (1.524 m)    Weights Current Weight  06/06/16 132 lb (59.9 kg)  05/23/16 133 lb (60.3 kg)  02/08/16 137 lb (62.1 kg)    Blood Pressure  BP Readings from Last 3 Encounters:  06/06/16 136/72  05/23/16 (!) 150/84  02/08/16 130/72     Admit date:  (Not on file) Last encounter with RMR:  06/02/2016   Allergy Penicillins  Current Outpatient Prescriptions  Medication Sig Dispense Refill  . alendronate (FOSAMAX) 70 MG tablet Take 70 mg by mouth once a week. Takes on Sunday    . aspirin 325 MG tablet Take 325 mg by mouth daily.    . cholecalciferol (VITAMIN D) 1000 UNITS tablet Take 1,000 Units by mouth daily.    . clopidogrel (PLAVIX) 75 MG tablet Take 75 mg by mouth daily.     . ferrous sulfate 325 (65 FE) MG tablet Take 325 mg by mouth daily with breakfast.    . furosemide (LASIX) 20 MG tablet Take 1 tablet (20 mg total) by mouth daily. 90 tablet 3  . levothyroxine (SYNTHROID, LEVOTHROID) 50 MCG tablet Take 50 mcg by mouth daily before breakfast.     . losartan (COZAAR) 25 MG tablet Take 1 tablet (25 mg total) by mouth daily. 90 tablet 3  . pantoprazole (PROTONIX) 40 MG tablet Take 40 mg by mouth daily.    . potassium chloride (MICRO-K) 10 MEQ CR capsule Take 10 mEq by mouth 2 (two) times daily.     . vitamin C (ASCORBIC ACID) 500 MG tablet Take 500 mg by mouth daily.     No current facility-administered medications for this visit.     Discontinued Meds:    Medications Discontinued During This Encounter  Medication Reason  . carvedilol (COREG) 3.125 MG tablet Error    Patient Active Problem List   Diagnosis Date Noted  . Anemia  06/28/2013    LABS    Component Value Date/Time   NA 135 05/27/2016 1551   NA 128 (L) 02/14/2015 2112   NA 131 (L) 07/03/2013 1252   K 4.8 05/27/2016 1551   K 3.2 (L) 02/14/2015 2112   K 3.2 (L) 07/03/2013 1252   CL 98 05/27/2016 1551   CL 95 (L) 02/14/2015 2112   CL 96 07/03/2013 1252   CO2 26 05/27/2016 1551   CO2 22 02/14/2015 2112   CO2 24 07/03/2013 1252   GLUCOSE 83 05/27/2016 1551   GLUCOSE 84 02/14/2015 2112   GLUCOSE 115 (H) 07/03/2013 1252   BUN 10 05/27/2016 1551   BUN 17 02/14/2015 2112   BUN 16 07/03/2013 1252   CREATININE 0.77 05/27/2016 1551   CREATININE 0.94 02/14/2015 2112   CREATININE 0.99 07/03/2013 1252   CALCIUM 9.7 05/27/2016 1551   CALCIUM 8.4 (L) 02/14/2015 2112   CALCIUM 9.4 07/03/2013 1252   GFRNONAA 56 (L) 02/14/2015 2112   GFRNONAA 53 (L) 07/03/2013 1252   GFRAA >60 02/14/2015 2112   GFRAA 62 (L) 07/03/2013 1252   CMP  Component Value Date/Time   NA 135 05/27/2016 1551   K 4.8 05/27/2016 1551   CL 98 05/27/2016 1551   CO2 26 05/27/2016 1551   GLUCOSE 83 05/27/2016 1551   BUN 10 05/27/2016 1551   CREATININE 0.77 05/27/2016 1551   CALCIUM 9.7 05/27/2016 1551   PROT 7.7 02/14/2015 2112   ALBUMIN 4.1 02/14/2015 2112   AST 29 02/14/2015 2112   ALT 17 02/14/2015 2112   ALKPHOS 47 02/14/2015 2112   BILITOT 0.6 02/14/2015 2112   GFRNONAA 56 (L) 02/14/2015 2112   GFRAA >60 02/14/2015 2112       Component Value Date/Time   WBC 5.1 02/14/2015 2112   WBC 4.0 07/03/2013 1252   HGB 10.7 (L) 02/14/2015 2112   HGB 8.4 (L) 07/03/2013 1252   HGB 8.2 (L) 06/29/2013 1638   HGB 6.1 06/27/2013   HCT 32.1 (L) 02/14/2015 2112   HCT 26.9 (L) 07/03/2013 1252   HCT 26.8 (L) 06/29/2013 1638   HCT 14 06/27/2013   MCV 88.7 02/14/2015 2112   MCV 77.5 (L) 07/03/2013 1252    Lipid Panel  No results found for: CHOL, TRIG, HDL, CHOLHDL, VLDL, LDLCALC, LDLDIRECT  ABG No results found for: PHART, PCO2ART, PO2ART, HCO3, TCO2, ACIDBASEDEF, O2SAT    No results found for: TSH BNP (last 3 results)  Recent Labs  05/27/16 1551  BNP 1,545.3*    ProBNP (last 3 results) No results for input(s): PROBNP in the last 8760 hours.  Cardiac Panel (last 3 results) No results for input(s): CKTOTAL, CKMB, TROPONINI, RELINDX in the last 72 hours.  Iron/TIBC/Ferritin/ %Sat No results found for: IRON, TIBC, FERRITIN, IRONPCTSAT   EKG Orders placed or performed in visit on 05/29/16  . EKG     Prior Assessment and Plan Problem List as of 06/06/2016 Reviewed: 06/28/2013  4:10 PM by Gelene Mink, NP     Other   Anemia   Last Assessment & Plan 06/28/2013 Office Visit Written 06/28/2013  4:15 PM by Nira Retort, NP    82 year old female with microcytic anemia, Hgb 6.1, unknown baseline. Blood transfusion scheduled for 11/19. Appears she was heme positive through GYN; she has had quite an interesting summer with hospitalization in [place] secondary to complicated diverticulitis, colostomy placement and reversal, and low anterior resection. Colonoscopy performed June 2014 via colostomy and rectum, limited due to poor prep but no obvious mass or polyps.   She has no evidence of hematochezia or melena. Vague upper GI symptoms to include early satiety, belching, and new-onset intermittent reflux. As she is on Plavix and ASA 325 mg daily, concern for occult upper GI source or small bowel etiology. Discussed proceeding with an EGD as first line in investigation; may ultimately need a capsule study. Colonoscopy performed this summer, and she does not have any overt signs of rectal bleeding. For this reason, proceed with EGD with Dr. Darrick Penna  and possible capsule to be scheduled if EGD negative.   Discussed in detail signs and symptoms that would necessitate urgent GI evaluation. She and her daughter stated understanding.           Imaging: Nm Myocar Multi W/spect W/Regis Motion / Ef  Result Date: 05/26/2016  Defect 1: There is a large defect of severe  severity present in the basal inferoseptal, basal inferior, basal inferolateral, mid inferoseptal, mid inferior, mid inferolateral, apical inferior and apical lateral location.  This is a high risk study.  Nuclear stress EF: 21%.  Findings consistent with prior  myocardial infarction with mild degree of peri-infarct ischemia primarily in the apical inferolateral walls.  ST-T abnormalities present at rest and throughout study.

## 2016-06-06 NOTE — Progress Notes (Signed)
*  PRELIMINARY RESULTS* Echocardiogram 2D Echocardiogram has been performed.  Stacey Drain 06/06/2016, 12:57 PM

## 2016-06-06 NOTE — Patient Instructions (Signed)
Your physician recommends that you schedule a follow-up appointment with Dr. Eden Emms after the Cath   Your physician recommends that you continue on your current medications as directed. Please refer to the Current Medication list given to you today.  Your physician has requested that you have a cardiac catheterization. Cardiac catheterization is used to diagnose and/or treat various heart conditions. Doctors may recommend this procedure for a number of different reasons. The most common reason is to evaluate chest pain. Chest pain can be a symptom of coronary artery disease (CAD), and cardiac catheterization can show whether plaque is narrowing or blocking your heart's arteries. This procedure is also used to evaluate the valves, as well as measure the blood flow and oxygen levels in different parts of your heart. For further information please visit [email or web address] Please follow instruction sheet, as given.  If you need a refill on your cardiac medications before your next appointment, please call your pharmacy.  Thank you for choosing [facility]!

## 2016-06-06 NOTE — Progress Notes (Signed)
Cardiology Office Note   Date:  06/06/2016   ID:  Cheryl [name], DOB June 10, 1933, MRN [id number]  PCP:  Fredirick Maudlin, MD  Cardiologist: Elzie Rings, NP   No chief complaint on file.     History of Present Illness: Cheryl [name] is a 82 y.o. female who presents for ongoing assessment and management of atypical chest pain, hypertension, history of hypercholesterolemia and hyperlipidemia with a history of TIA. Was last seen by Dr. Eden Emms on 05/23/2016, he was scheduled for [facility]   Defect 1: There is a large defect of severe severity present in the basal inferoseptal, basal inferior, basal inferolateral, mid inferoseptal, mid inferior, mid inferolateral, apical inferior and apical lateral location.  This is a high risk study.  Nuclear stress EF: 21%.  Findings consistent with prior myocardial infarction with mild degree of peri-infarct ischemia primarily in the apical inferolateral walls.  ST-T abnormalities present at rest and throughout study.  She is here today to discuss the need to proceed with cardiac catheterization. Dr. Velora Mediate and has artery spoke with her daughter. She is in agreement to proceed with catheterization. She has multiple questions. She denies any recurrent chest pain that she is fatigued. Past Medical History:  Diagnosis Date  . Anxiety   . Bulging discs   . Degenerative disc disease   . Diverticulitis    complicated, with microperforation, sigmoid stricture  . Hypertension   . Tuberculosis     Past Surgical History:  Procedure Laterality Date  . APPENDECTOMY    . COCCYX REMOVAL    . COLONOSCOPY  June 2014   Dr. Aliene Beams in [place]: via colostomy and rectum. scattered diverticula. no obvious polyps. Hemorrhoids noted in anal canal. Exam limited due to poor prep  . COLOSTOMY  Dec 13, 2012   Dec 13, 2012 placed. Reversed   . ESOPHAGOGASTRODUODENOSCOPY N/A 07/01/2013   Procedure: ESOPHAGOGASTRODUODENOSCOPY (EGD);  Surgeon:  West Bali, MD;  [facility];  Service: Endoscopy;  Laterality: N/A;  3:00PM  . FLEXIBLE SIGMOIDOSCOPY  December 08, 2012   Dr. Kerby Less in [place]: very edematous sigmoid colon, unable to advance scope past 25-30 cm despite attempt with peds scope.   Marland Kitchen KNEE ARTHROSCOPY Right   . LAPAROSCOPIC LOW ANTERIOR RESECTION  summer 2014   with reversal of colostomy  . LAPAROSCOPY  May 2014     Current Outpatient Prescriptions  Medication Sig Dispense Refill  . alendronate (FOSAMAX) 70 MG tablet Take 70 mg by mouth once a week. Takes on Sunday    . aspirin 325 MG tablet Take 325 mg by mouth daily.    . cholecalciferol (VITAMIN D) 1000 UNITS tablet Take 1,000 Units by mouth daily.    . clopidogrel (PLAVIX) 75 MG tablet Take 75 mg by mouth daily.     . ferrous sulfate 325 (65 FE) MG tablet Take 325 mg by mouth daily with breakfast.    . furosemide (LASIX) 20 MG tablet Take 1 tablet (20 mg total) by mouth daily. 90 tablet 3  . levothyroxine (SYNTHROID, LEVOTHROID) 50 MCG tablet Take 50 mcg by mouth daily before breakfast.     . losartan (COZAAR) 25 MG tablet Take 1 tablet (25 mg total) by mouth daily. 90 tablet 3  . pantoprazole (PROTONIX) 40 MG tablet Take 40 mg by mouth daily.    . potassium chloride (MICRO-K) 10 MEQ CR capsule Take 10 mEq by mouth 2 (two) times daily.     . vitamin C (  ASCORBIC ACID) 500 MG tablet Take 500 mg by mouth daily.     No current facility-administered medications for this visit.     Allergies:   Penicillins    Social History:  The patient  reports that she has never smoked. She has never used smokeless tobacco. She reports that she drinks alcohol. She reports that she does not use drugs.   Family History:  The patient's family history includes Heart disease in her mother and sister; Stroke in her father.    ROS: All other systems are reviewed and negative. Unless otherwise mentioned in H&P    PHYSICAL EXAM: VS:  BP 136/72   Pulse 62   Ht 5'  (1.524 m)   Wt 132 lb (59.9 kg)   SpO2 98%   BMI 25.78 kg/m  , BMI Body mass index is 25.78 kg/m. GEN: Well nourished, well developed, in no acute distress  HEENT: normal  Neck: no JVD, carotid bruits, or masses Cardiac: RRR; no murmurs, rubs, or gallops,no edema  Respiratory:  clear to auscultation bilaterally, normal work of breathing GI: soft, nontender, nondistended, + BS MS: no deformity or atrophy  Skin: warm and dry, no rash Neuro:  Strength and sensation are intact Psych: euthymic mood, full affect  Recent Labs: 05/27/2016: Brain Natriuretic Peptide 1,545.3; BUN 10; Creat 0.77; Potassium 4.8; Sodium 135     Wt Readings from Last 3 Encounters:  06/06/16 132 lb (59.9 kg)  05/23/16 133 lb (60.3 kg)  02/08/16 137 lb (62.1 kg)     ASSESSMENT AND PLAN:  1.  Abnormal stress test:: Large defect of severe severity in the basal inferior septal, inferior septal, basal inferior, basal inferior lateral, mid and area septal, mid inferior, mid inferior lateral, apical inferior and apical lateral location. This is a high risk study. Subsequent echocardiogram confirmed reduced LV systolic function demonstrating EF of 25-30% with grade 1 diastolic dysfunction. The patient is therefore planned for cardiac catheterization.  I have spent a great deal of time explaining the procedure, answering multiple questions, risks and benefits, and description of need for possible intervention and overnight stay if necessary. She is willing to proceed. We have scheduled her cardiac catheterization at the request of Dr. Nish and on Tuesday, October 31 at 10:30 AM. This will be completed by Dr. Harding. She will follow-up in the office post procedure for ongoing management.  2. Systolic dysfunction: Echocardiogram dated 06/06/2016 demonstrated significantly reduced LV systolic function, EF of 25% to 30% with akinesis in scarring of the inferior lateral inferior myocardium. She is also found to have grade 1  diastolic dysfunction. We will continue her on carvedilol 3.125 mg twice a day. She was not certain that she needs to take it. I've advised her that this is important for her to do. She will begin taking it again immediately.  3. Hypertension: Currently well-controlled. Will not make any further changes in medication regimen. She is advised to take medications as directed to include carvedilol.   Current medicines are reviewed at length with the patient today.    Labs/ tests ordered today include: Cardiac catheterization with precath labs.  No orders of the defined types were placed in this encounter.    Disposition:   FU with postprocedure.  Signed, Jaydrian Corpening, NP  06/06/2016 3:15 PM    [facility] [address], [place], [place] [address] Phone: [phone]; Fax: [phone] 

## 2016-06-09 ENCOUNTER — Telehealth: Payer: Self-pay | Admitting: Cardiovascular Disease

## 2016-06-09 ENCOUNTER — Telehealth: Payer: Self-pay | Admitting: Adult Health

## 2016-06-09 NOTE — Telephone Encounter (Signed)
I can try to call tomorrow if I have time during the Cath Lab today, otherwise will try to call Wednesday. I think Dr. Eden Emms talked to the family as well.  [name], MD

## 2016-06-09 NOTE — Telephone Encounter (Signed)
See prior note. Daughter left 2 numbers to call back - either [phone] or [phone].

## 2016-06-09 NOTE — Telephone Encounter (Signed)
Pt daughter is to talk to Dr.Harding or Dr.Nishan regarding upcoming procedure for her mother. She lives in [place] and she is concerned because of her mothers age. She can be reached after 5pm @[phone]

## 2016-06-09 NOTE — Telephone Encounter (Signed)
Daughter in [place] had family meeting and wants cath rescheduled to Thurs 06/12/16 at 1030 hrs, from tomorrow. There is a daughtr coming from [place] for cath on Thursday  Daughter, Cheryl [name] in [place] [phone] would like to speak with Dr Herbie Baltimore before cath. Our NP is not in office this week.

## 2016-06-09 NOTE — Telephone Encounter (Signed)
Please call pt's daughter Lauris Poag --[phone] ask for the pt's daughter

## 2016-06-10 NOTE — Telephone Encounter (Signed)
Daughter informed of dr Rodrigo Ran

## 2016-06-12 ENCOUNTER — Ambulatory Visit (HOSPITAL_COMMUNITY)
Admission: RE | Admit: 2016-06-12 | Discharge: 2016-06-12 | Disposition: A | Discharge status: Home / Self Care | Payer: Medicare Other | Source: Ambulatory Visit | Attending: Cardiology | Admitting: Cardiology

## 2016-06-12 ENCOUNTER — Encounter (HOSPITAL_COMMUNITY)
Admission: RE | Disposition: A | Discharge status: Home / Self Care | Payer: Self-pay | Source: Ambulatory Visit | Attending: Cardiology

## 2016-06-12 DIAGNOSIS — Z9889 Other specified postprocedural states: Secondary | ICD-10-CM | POA: Diagnosis not present

## 2016-06-12 DIAGNOSIS — Z880 Allergy status to penicillin: Secondary | ICD-10-CM | POA: Insufficient documentation

## 2016-06-12 DIAGNOSIS — Z7902 Long term (current) use of antithrombotics/antiplatelets: Secondary | ICD-10-CM | POA: Diagnosis not present

## 2016-06-12 DIAGNOSIS — Z8673 Personal history of transient ischemic attack (TIA), and cerebral infarction without residual deficits: Secondary | ICD-10-CM | POA: Insufficient documentation

## 2016-06-12 DIAGNOSIS — F419 Anxiety disorder, unspecified: Secondary | ICD-10-CM | POA: Insufficient documentation

## 2016-06-12 DIAGNOSIS — Z9049 Acquired absence of other specified parts of digestive tract: Secondary | ICD-10-CM | POA: Diagnosis not present

## 2016-06-12 DIAGNOSIS — Z8611 Personal history of tuberculosis: Secondary | ICD-10-CM | POA: Insufficient documentation

## 2016-06-12 DIAGNOSIS — E7800 Pure hypercholesterolemia, unspecified: Secondary | ICD-10-CM | POA: Insufficient documentation

## 2016-06-12 DIAGNOSIS — Z8719 Personal history of other diseases of the digestive system: Secondary | ICD-10-CM | POA: Diagnosis not present

## 2016-06-12 DIAGNOSIS — R9439 Abnormal result of other cardiovascular function study: Secondary | ICD-10-CM | POA: Diagnosis present

## 2016-06-12 DIAGNOSIS — I10 Essential (primary) hypertension: Secondary | ICD-10-CM | POA: Insufficient documentation

## 2016-06-12 DIAGNOSIS — I249 Acute ischemic heart disease, unspecified: Secondary | ICD-10-CM

## 2016-06-12 DIAGNOSIS — I208 Other forms of angina pectoris: Secondary | ICD-10-CM | POA: Diagnosis present

## 2016-06-12 DIAGNOSIS — I2510 Atherosclerotic heart disease of native coronary artery without angina pectoris: Secondary | ICD-10-CM | POA: Diagnosis not present

## 2016-06-12 DIAGNOSIS — Z7982 Long term (current) use of aspirin: Secondary | ICD-10-CM | POA: Insufficient documentation

## 2016-06-12 DIAGNOSIS — Z79899 Other long term (current) drug therapy: Secondary | ICD-10-CM | POA: Diagnosis not present

## 2016-06-12 DIAGNOSIS — I255 Ischemic cardiomyopathy: Secondary | ICD-10-CM | POA: Diagnosis present

## 2016-06-12 HISTORY — PX: CARDIAC CATHETERIZATION: SHX172

## 2016-06-12 LAB — BASIC METABOLIC PANEL
Anion gap: 10 (ref 5–15)
BUN: 10 mg/dL (ref 6–20)
CHLORIDE: 94 mmol/L — AB (ref 101–111)
CO2: 26 mmol/L (ref 22–32)
CREATININE: 0.76 mg/dL (ref 0.44–1.00)
Calcium: 9.8 mg/dL (ref 8.9–10.3)
GFR calc Af Amer: >60 mL/min (ref >60)
GFR calc non Af Amer: >60 mL/min (ref >60)
GLUCOSE: 92 mg/dL (ref 65–99)
Potassium: 3.1 mmol/L — ABNORMAL LOW (ref 3.5–5.1)
SODIUM: 130 mmol/L — AB (ref 135–145)

## 2016-06-12 LAB — CBC
HCT: 35.7 % — ABNORMAL LOW (ref 36.0–46.0)
Hemoglobin: 11.7 g/dL — ABNORMAL LOW (ref 12.0–15.0)
MCH: 28.7 pg (ref 26.0–34.0)
MCHC: 32.8 g/dL (ref 30.0–36.0)
MCV: 87.7 fL (ref 78.0–100.0)
PLATELETS: 209 10*3/uL (ref 150–400)
RBC: 4.07 MIL/uL (ref 3.87–5.11)
RDW: 13.4 % (ref 11.5–15.5)
WBC: 3.6 10*3/uL — AB (ref 4.0–10.5)

## 2016-06-12 LAB — PROTIME-INR
INR: 1.1
Prothrombin Time: 14.2 seconds (ref 11.4–15.2)

## 2016-06-12 SURGERY — LEFT HEART CATH AND CORONARY ANGIOGRAPHY
Anesthesia: LOCAL

## 2016-06-12 MED ORDER — IOPAMIDOL (ISOVUE-370) INJECTION 76%
INTRAVENOUS | Status: DC | PRN
  Administered 2016-06-12: 70 mL via INTRA_ARTERIAL

## 2016-06-12 MED ORDER — MIDAZOLAM HCL 2 MG/2ML IJ SOLN
INTRAMUSCULAR | Status: DC | PRN
  Administered 2016-06-12: 0.5 mg via INTRAVENOUS

## 2016-06-12 MED ORDER — LIDOCAINE HCL (PF) 1 % IJ SOLN
INTRAMUSCULAR | Status: DC | PRN
  Administered 2016-06-12: 4 mL via INTRADERMAL

## 2016-06-12 MED ORDER — VERAPAMIL HCL 2.5 MG/ML IV SOLN
INTRAVENOUS | Status: AC
  Filled 2016-06-12: qty 2

## 2016-06-12 MED ORDER — HEPARIN SODIUM (PORCINE) 1000 UNIT/ML IJ SOLN
INTRAMUSCULAR | Status: AC
  Filled 2016-06-12: qty 1

## 2016-06-12 MED ORDER — IOPAMIDOL (ISOVUE-370) INJECTION 76%
INTRAVENOUS | Status: AC
  Filled 2016-06-12: qty 100

## 2016-06-12 MED ORDER — SODIUM CHLORIDE 0.9 % IV SOLN: 250.0000 mL | INTRAVENOUS | Status: DC | PRN

## 2016-06-12 MED ORDER — POTASSIUM CHLORIDE CRYS ER 20 MEQ PO TBCR
EXTENDED_RELEASE_TABLET | ORAL | Status: AC
  Filled 2016-06-12: qty 1

## 2016-06-12 MED ORDER — SODIUM CHLORIDE 0.9% FLUSH: 3.0000 mL | INTRAVENOUS | Status: DC | PRN

## 2016-06-12 MED ORDER — POTASSIUM CHLORIDE CRYS ER 20 MEQ PO TBCR
40.0000 meq | EXTENDED_RELEASE_TABLET | Freq: Once | ORAL | Status: AC
  Administered 2016-06-12: 40 meq via ORAL

## 2016-06-12 MED ORDER — ASPIRIN EC 81 MG PO TBEC
81.0000 mg | DELAYED_RELEASE_TABLET | Freq: Every day | ORAL | Status: DC
  Filled 2016-06-12: qty 1

## 2016-06-12 MED ORDER — ONDANSETRON HCL 4 MG/2ML IJ SOLN: 4.0000 mg | Freq: Four times a day (QID) | INTRAMUSCULAR | Status: DC | PRN

## 2016-06-12 MED ORDER — HEPARIN (PORCINE) IN NACL 2-0.9 UNIT/ML-% IJ SOLN
INTRAMUSCULAR | Status: AC
  Filled 2016-06-12: qty 1000

## 2016-06-12 MED ORDER — SODIUM CHLORIDE 0.9% FLUSH: 3.0000 mL | Freq: Two times a day (BID) | INTRAVENOUS | Status: DC

## 2016-06-12 MED ORDER — MIDAZOLAM HCL 2 MG/2ML IJ SOLN
INTRAMUSCULAR | Status: AC
  Filled 2016-06-12: qty 2

## 2016-06-12 MED ORDER — SODIUM CHLORIDE 0.9 % WEIGHT BASED INFUSION: 1.0000 mL/kg/h | INTRAVENOUS | Status: DC

## 2016-06-12 MED ORDER — ASPIRIN 81 MG PO CHEW: 81.0000 mg | CHEWABLE_TABLET | ORAL | Status: DC

## 2016-06-12 MED ORDER — FENTANYL CITRATE (PF) 100 MCG/2ML IJ SOLN
INTRAMUSCULAR | Status: DC | PRN
  Administered 2016-06-12: 12.5 ug via INTRAVENOUS

## 2016-06-12 MED ORDER — SODIUM CHLORIDE 0.9 % IV SOLN
INTRAVENOUS | Status: DC
  Administered 2016-06-12: 09:00:00 via INTRAVENOUS

## 2016-06-12 MED ORDER — HEPARIN (PORCINE) IN NACL 2-0.9 UNIT/ML-% IJ SOLN
INTRAMUSCULAR | Status: DC | PRN
  Administered 2016-06-12: 1000 mL via INTRA_ARTERIAL

## 2016-06-12 MED ORDER — ACETAMINOPHEN 325 MG PO TABS: 650.0000 mg | ORAL_TABLET | ORAL | Status: DC | PRN

## 2016-06-12 MED ORDER — LIDOCAINE HCL (PF) 1 % IJ SOLN
INTRAMUSCULAR | Status: AC
  Filled 2016-06-12: qty 30

## 2016-06-12 MED ORDER — HEPARIN (PORCINE) IN NACL 2-0.9 UNIT/ML-% IJ SOLN
INTRAMUSCULAR | Status: DC | PRN
  Administered 2016-06-12: 12:00:00 via INTRA_ARTERIAL

## 2016-06-12 MED ORDER — FENTANYL CITRATE (PF) 100 MCG/2ML IJ SOLN
INTRAMUSCULAR | Status: AC
  Filled 2016-06-12: qty 2

## 2016-06-12 MED ORDER — HEPARIN SODIUM (PORCINE) 1000 UNIT/ML IJ SOLN
INTRAMUSCULAR | Status: DC | PRN
  Administered 2016-06-12: 3000 [IU] via INTRAVENOUS

## 2016-06-12 SURGICAL SUPPLY — 10 items
CATH INFINITI 5FR ANG PIGTAIL (CATHETERS) ×2 IMPLANT
CATH OPTITORQUE TIG 4.0 5F (CATHETERS) ×1 IMPLANT
DEVICE RAD COMP TR BAND LRG (VASCULAR PRODUCTS) ×1 IMPLANT
GLIDESHEATH SLEND A-KIT 6F 22G (SHEATH) ×1 IMPLANT
KIT HEART LEFT (KITS) ×2 IMPLANT
PACK CARDIAC CATHETERIZATION (CUSTOM PROCEDURE TRAY) ×2 IMPLANT
TRANSDUCER W/STOPCOCK (MISCELLANEOUS) ×2 IMPLANT
TUBING CIL FLEX 10 FLL-RA (TUBING) ×2 IMPLANT
WIRE HI TORQ VERSACORE-J 145CM (WIRE) ×2 IMPLANT
WIRE SAFE-T 1.5MM-J .035X260CM (WIRE) ×1 IMPLANT

## 2016-06-12 NOTE — H&P (View-Only) (Signed)
Cardiology Office Note   Date:  06/06/2016   ID:  Cheryl [name], DOB June 10, 1933, MRN [id number]  PCP:  Fredirick Maudlin, MD  Cardiologist: Elzie Rings, NP   No chief complaint on file.     History of Present Illness: Cheryl [name] is a 82 y.o. female who presents for ongoing assessment and management of atypical chest pain, hypertension, history of hypercholesterolemia and hyperlipidemia with a history of TIA. Was last seen by Dr. Eden Emms on 05/23/2016, he was scheduled [facility]   Defect 1: There is a large defect of severe severity present in the basal inferoseptal, basal inferior, basal inferolateral, mid inferoseptal, mid inferior, mid inferolateral, apical inferior and apical lateral location.  This is a high risk study.  Nuclear stress EF: 21%.  Findings consistent with prior myocardial infarction with mild degree of peri-infarct ischemia primarily in the apical inferolateral walls.  ST-T abnormalities present at rest and throughout study.  She is here today to discuss the need to proceed with cardiac catheterization. Dr. Velora Mediate and has artery spoke with her daughter. She is in agreement to proceed with catheterization. She has multiple questions. She denies any recurrent chest pain that she is fatigued. Past Medical History:  Diagnosis Date  . Anxiety   . Bulging discs   . Degenerative disc disease   . Diverticulitis    complicated, with microperforation, sigmoid stricture  . Hypertension   . Tuberculosis     Past Surgical History:  Procedure Laterality Date  . APPENDECTOMY    . COCCYX REMOVAL    . COLONOSCOPY  June 2014   Dr. Aliene Beams in [place]: via colostomy and rectum. scattered diverticula. no obvious polyps. Hemorrhoids noted in anal canal. Exam limited due to poor prep  . COLOSTOMY  Dec 13, 2012   Dec 13, 2012 placed. Reversed   . ESOPHAGOGASTRODUODENOSCOPY N/A 07/01/2013   Procedure: ESOPHAGOGASTRODUODENOSCOPY (EGD);  Surgeon:  West Bali, MD;  Location: [facility];  Service: Endoscopy;  Laterality: N/A;  3:00PM  . FLEXIBLE SIGMOIDOSCOPY  December 08, 2012   Dr. Kerby Less in [place]: very edematous sigmoid colon, unable to advance scope past 25-30 cm despite attempt with peds scope.   Marland Kitchen KNEE ARTHROSCOPY Right   . LAPAROSCOPIC LOW ANTERIOR RESECTION  summer 2014   with reversal of colostomy  . LAPAROSCOPY  May 2014     Current Outpatient Prescriptions  Medication Sig Dispense Refill  . alendronate (FOSAMAX) 70 MG tablet Take 70 mg by mouth once a week. Takes on Sunday    . aspirin 325 MG tablet Take 325 mg by mouth daily.    . cholecalciferol (VITAMIN D) 1000 UNITS tablet Take 1,000 Units by mouth daily.    . clopidogrel (PLAVIX) 75 MG tablet Take 75 mg by mouth daily.     . ferrous sulfate 325 (65 FE) MG tablet Take 325 mg by mouth daily with breakfast.    . furosemide (LASIX) 20 MG tablet Take 1 tablet (20 mg total) by mouth daily. 90 tablet 3  . levothyroxine (SYNTHROID, LEVOTHROID) 50 MCG tablet Take 50 mcg by mouth daily before breakfast.     . losartan (COZAAR) 25 MG tablet Take 1 tablet (25 mg total) by mouth daily. 90 tablet 3  . pantoprazole (PROTONIX) 40 MG tablet Take 40 mg by mouth daily.    . potassium chloride (MICRO-K) 10 MEQ CR capsule Take 10 mEq by mouth 2 (two) times daily.     . vitamin C (  ASCORBIC ACID) 500 MG tablet Take 500 mg by mouth daily.     No current facility-administered medications for this visit.     Allergies:   Penicillins    Social History:  The patient  reports that she has never smoked. She has never used smokeless tobacco. She reports that she drinks alcohol. She reports that she does not use drugs.   Family History:  The patient's family history includes Heart disease in her mother and sister; Stroke in her father.    ROS: All other systems are reviewed and negative. Unless otherwise mentioned in H&P    PHYSICAL EXAM: VS:  BP 136/72   Pulse 62   Ht 5'  (1.524 m)   Wt 132 lb (59.9 kg)   SpO2 98%   BMI 25.78 kg/m  , BMI Body mass index is 25.78 kg/m. GEN: Well nourished, well developed, in no acute distress  HEENT: normal  Neck: no JVD, carotid bruits, or masses Cardiac: RRR; no murmurs, rubs, or gallops,no edema  Respiratory:  clear to auscultation bilaterally, normal work of breathing GI: soft, nontender, nondistended, + BS MS: no deformity or atrophy  Skin: warm and dry, no rash Neuro:  Strength and sensation are intact Psych: euthymic mood, full affect  Recent Labs: 05/27/2016: Brain Natriuretic Peptide 1,545.3; BUN 10; Creat 0.77; Potassium 4.8; Sodium 135     Wt Readings from Last 3 Encounters:  06/06/16 132 lb (59.9 kg)  05/23/16 133 lb (60.3 kg)  02/08/16 137 lb (62.1 kg)     ASSESSMENT AND PLAN:  1.  Abnormal stress test:: Large defect of severe severity in the basal inferior septal, inferior septal, basal inferior, basal inferior lateral, mid and area septal, mid inferior, mid inferior lateral, apical inferior and apical lateral location. This is a high risk study. Subsequent echocardiogram confirmed reduced LV systolic function demonstrating EF of 25-30% with grade 1 diastolic dysfunction. The patient is therefore planned for cardiac catheterization.  I have spent a great deal of time explaining the procedure, answering multiple questions, risks and benefits, and description of need for possible intervention and overnight stay if necessary. She is willing to proceed. We have scheduled her cardiac catheterization at the request of Dr. Velora MediateNish and on Tuesday, October 31 at 10:30 AM. This will be completed by Dr. Herbie BaltimoreHarding. She will follow-up in the office post procedure for ongoing management.  2. Systolic dysfunction: Echocardiogram dated 06/06/2016 demonstrated significantly reduced LV systolic function, EF of 25% to 30% with akinesis in scarring of the inferior lateral inferior myocardium. She is also found to have grade 1  diastolic dysfunction. We will continue her on carvedilol 3.125 mg twice a day. She was not certain that she needs to take it. I've advised her that this is important for her to do. She will begin taking it again immediately.  3. Hypertension: Currently well-controlled. Will not make any further changes in medication regimen. She is advised to take medications as directed to include carvedilol.   Current medicines are reviewed at length with the patient today.    Labs/ tests ordered today include: Cardiac catheterization with precath labs.  No orders of the defined types were placed in this encounter.    Disposition:   FU with postprocedure.  Signed, Joni ReiningKathryn Gunner Iodice, NP  06/06/2016 3:15 PM    [facility] [address], [place], [place] [address] Phone: [phone]; Fax: [phone]

## 2016-06-12 NOTE — Discharge Instructions (Signed)
Increase lasix// furosemide to 40 mg daily, If you have difficulty breathing at night take 1 nitro and 1 extra lasix 20 mg. Per Dr Herbie Baltimore   Radial Site Care Refer to this sheet in the next few weeks. These instructions provide you with information about caring for yourself after your procedure. Your health care provider may also give you more specific instructions. Your treatment has been planned according to current medical practices, but problems sometimes occur. Call your health care provider if you have any problems or questions after your procedure. WHAT TO EXPECT AFTER THE PROCEDURE After your procedure, it is typical to have the following:  Bruising at the radial site that usually fades within 1-2 weeks.  Blood collecting in the tissue (hematoma) that may be painful to the touch. It should usually decrease in size and tenderness within 1-2 weeks. HOME CARE INSTRUCTIONS  Take medicines only as directed by your health care provider.  You may shower 24-48 hours after the procedure or as directed by your health care provider. Remove the bandage (dressing) and gently wash the site with plain soap and water. Pat the area dry with a clean towel. Do not rub the site, because this may cause bleeding.  Do not take baths, swim, or use a hot tub until your health care provider approves.  Check your insertion site every day for redness, swelling, or drainage.  Do not apply powder or lotion to the site.  Do not flex or bend the affected arm for 24 hours or as directed by your health care provider.  Do not push or pull heavy objects with the affected arm for 24 hours or as directed by your health care provider.  Do not lift over 10 lb (4.5 kg) for 5 days after your procedure or as directed by your health care provider.  Ask your health care provider when it is okay to:  Return to work or school.  Resume usual physical activities or sports.  Resume sexual activity.  Do not drive home if  you are discharged the same day as the procedure. Have someone else drive you.  You may drive 24 hours after the procedure unless otherwise instructed by your health care provider.  Do not operate machinery or power tools for 24 hours after the procedure.  If your procedure was done as an outpatient procedure, which means that you went home the same day as your procedure, a responsible adult should be with you for the first 24 hours after you arrive home.  Keep all follow-up visits as directed by your health care provider. This is important. SEEK MEDICAL CARE IF:  You have a fever.  You have chills.  You have increased bleeding from the radial site. Hold pressure on the site. CALL 911 SEEK IMMEDIATE MEDICAL CARE IF:  You have unusual pain at the radial site.  You have redness, warmth, or swelling at the radial site.  You have drainage (other than a small amount of blood on the dressing) from the radial site.  The radial site is bleeding, and the bleeding does not stop after 30 minutes of holding steady pressure on the site.  Your arm or hand becomes pale, cool, tingly, or numb.   This information is not intended to replace advice given to you by your health care provider. Make sure you discuss any questions you have with your health care provider.   Document Released: 08/30/2010 Document Revised: 08/18/2014 Document Reviewed: 02/13/2014 Elsevier Interactive Patient Education 2016 Elsevier  Inc. ° °

## 2016-06-12 NOTE — Interval H&P Note (Signed)
History and Physical Interval Note:  06/12/2016 11:21 AM  Cheryl [name]  has presented today for surgery, with the diagnosis of abnormal stress test - suggestive of ischemic cardiac myopathy The various methods of treatment have been discussed with the patient and family. After consideration of risks, benefits and other options for treatment, the patient has consented to  Procedure(s): Left Heart Cath and Coronary Angiography (N/A) with possible Percutaneous Coronary Intervention as a surgical intervention .  The patient's history has been reviewed, patient examined, no change in status, stable for surgery.  I have reviewed the patient's chart and labs.  Questions were answered to the patient's satisfaction.    Cath Lab Visit (complete for each Cath Lab visit)  Clinical Evaluation Leading to the Procedure:   ACS: No.  Non-ACS:    Anginal Classification: CCS III  Anti-ischemic medical therapy: Minimal Therapy (1 class of medications)  Non-Invasive Test Results: High-risk stress test findings: cardiac mortality >3%/year  Prior CABG: No previous CABG  Cheryl [name]

## 2016-06-13 ENCOUNTER — Encounter (HOSPITAL_COMMUNITY): Payer: Self-pay | Admitting: Cardiology

## 2016-06-16 ENCOUNTER — Institutional Professional Consult (permissible substitution) (INDEPENDENT_AMBULATORY_CARE_PROVIDER_SITE_OTHER): Payer: Medicare Other | Admitting: Thoracic Surgery (Cardiothoracic Vascular Surgery)

## 2016-06-16 ENCOUNTER — Encounter: Payer: Self-pay | Admitting: Thoracic Surgery (Cardiothoracic Vascular Surgery)

## 2016-06-16 VITALS — BP 97/67 | HR 67 | Resp 20 | Ht 60.0 in | Wt 128.0 lb

## 2016-06-16 DIAGNOSIS — I2510 Atherosclerotic heart disease of native coronary artery without angina pectoris: Secondary | ICD-10-CM

## 2016-06-16 DIAGNOSIS — I249 Acute ischemic heart disease, unspecified: Secondary | ICD-10-CM | POA: Diagnosis not present

## 2016-06-16 NOTE — Progress Notes (Signed)
PCP is Fredirick Maudlin, MD Referring Provider is Marykay Lex, MD  Chief Complaint  Patient presents with  . Coronary Artery Disease    Surgical eval, Cardiac Cath 06/12/16, ECHO 06/06/16    HPI: Cheryl [name] is an 82 year old woman who is sent for consultation regarding three-vessel coronary disease.  Cheryl [name] is an 82 year old woman with a past medical history significant for hypertension, TIA, osteoporosis, arthritis, gastroesophageal reflux, and diverticulitis requiring colostomy and then colostomy takedown. She had no history of CAD known to her, although she says she did have a catheterization many years ago. She recently developed pain in her anterior left chest. This was relatively short-lived and she began having episodes of subscapular pain on the left side. This is not necessarily related to exertion. She saw Dr. Juanetta Gosling. EKG showed changes. She was referred to Dr. Eden Emms. He did a stress Myoview which showed a large severe defect consistent with an old myocardial infarction with peri-infarct ischemia. An echocardiogram showed severe left ventricular dysfunction with ejection fraction of 25%. There was some sclerosis but no stenosis of the aortic valve. Dr. Herbie Baltimore performed cardiac catheterization which revealed severe three-vessel coronary disease with total occlusion of the right coronary and circumflex and a 70% stenosis in the LAD with an 80% stenosis in a large first diagonal. There was no significant gradient across the aortic valve.  She has continued to have subscapular pain intermittently and sporadically. She had a brief episode last night that lasted only minutes. She does complain of shortness of breath when she tries to lay flat and has been sleeping in a recliner. She has developed numbness in her feet over the past 3 weeks. She and her daughter thinks this is related to starting furosemide.  She had a possible TIA many years ago. She has been on clopidogrel for that. She  has not had any neurologic symptoms recently other than the numbness in her feet. Her clopidogrel was stopped after her catheterization.  She had laser ablation/stab phlebectomy of her right greater saphenous vein and tributaries by Dr. Arbie Cookey in 2008  She does remain fairly independent and lives by herself. She does drive to the grocery store and take care of her own household. Past Medical History:  Diagnosis Date  . Anxiety   . Bulging discs   . Degenerative disc disease   . Diverticulitis    complicated, with microperforation, sigmoid stricture  . Hypertension   . Tuberculosis     Past Surgical History:  Procedure Laterality Date  . APPENDECTOMY    . CARDIAC CATHETERIZATION N/A 06/12/2016   Procedure: Left Heart Cath and Coronary Angiography;  Surgeon: Marykay Lex, MD;  Location: [facility] CV LAB;  Service: Cardiovascular;  Laterality: N/A;  . COCCYX REMOVAL    . COLONOSCOPY  June 2014   Dr. Aliene Beams in [place]: via colostomy and rectum. scattered diverticula. no obvious polyps. Hemorrhoids noted in anal canal. Exam limited due to poor prep  . COLOSTOMY  Dec 13, 2012   Dec 13, 2012 placed. Reversed   . ESOPHAGOGASTRODUODENOSCOPY N/A 07/01/2013   Procedure: ESOPHAGOGASTRODUODENOSCOPY (EGD);  Surgeon: West Bali, MD;  Location: [facility];  Service: Endoscopy;  Laterality: N/A;  3:00PM  . FLEXIBLE SIGMOIDOSCOPY  December 08, 2012   Dr. Kerby Less in [place]: very edematous sigmoid colon, unable to advance scope past 25-30 cm despite attempt with peds scope.   Marland Kitchen KNEE ARTHROSCOPY Right   . LAPAROSCOPIC LOW ANTERIOR RESECTION  summer 2014  with reversal of colostomy  . LAPAROSCOPY  May 2014    Family History  Problem Relation Age of Onset  . Heart disease Mother   . Stroke Father   . Heart disease Sister   . Colon cancer Neg Hx     Social History Social History  Substance Use Topics  . Smoking status: Never Smoker  . Smokeless tobacco: Never Used  .  Alcohol use Yes     Comment: occasional    Current Outpatient Prescriptions  Medication Sig Dispense Refill  . alendronate (FOSAMAX) 70 MG tablet Take 70 mg by mouth once a week. Takes on Sunday    . aspirin EC 81 MG tablet Take 81 mg by mouth daily.    . cholecalciferol (VITAMIN D) 1000 UNITS tablet Take 1,000 Units by mouth daily.    . ferrous sulfate 325 (65 FE) MG tablet Take 325 mg by mouth daily with breakfast.    . furosemide (LASIX) 20 MG tablet Take 1 tablet (20 mg total) by mouth daily. (Patient taking differently: Take 40 mg by mouth daily. ) 90 tablet 3  . indapamide (LOZOL) 1.25 MG tablet Take 1.25 mg by mouth daily.    Marland Kitchen levothyroxine (SYNTHROID, LEVOTHROID) 50 MCG tablet Take 50 mcg by mouth daily before breakfast.     . losartan (COZAAR) 25 MG tablet Take 1 tablet (25 mg total) by mouth daily. 90 tablet 3  . pantoprazole (PROTONIX) 40 MG tablet Take 40 mg by mouth daily.    . potassium chloride (MICRO-K) 10 MEQ CR capsule Take 10 mEq by mouth 2 (two) times daily.     . sodium chloride (OCEAN) 0.65 % SOLN nasal spray Place 1 spray into both nostrils as needed for congestion.    . vitamin C (ASCORBIC ACID) 500 MG tablet Take 500 mg by mouth daily.     No current facility-administered medications for this visit.     Allergies  Allergen Reactions  . Penicillins Hives    Has patient had a PCN reaction causing immediate rash, facial/tongue/throat swelling, SOB or lightheadedness with hypotension: Yes Has patient had a PCN reaction causing severe rash involving mucus membranes or skin necrosis: Yes Has patient had a PCN reaction that required hospitalization No Has patient had a PCN reaction occurring within the last 10 years: No If all of the above answers are "NO", then may proceed with Cephalosporin use.     Review of Systems  Constitutional: Positive for activity change and fatigue. Negative for chills, fever and unexpected weight change.  Eyes: Negative for visual  disturbance.  Respiratory: Positive for shortness of breath. Negative for cough and wheezing.        Orthopnea  Cardiovascular: Positive for chest pain (Atypical). Negative for palpitations and leg swelling.       Severe varicose veins. Previous ablation and stripping on right leg  Gastrointestinal: Positive for abdominal pain (Reflux). Negative for blood in stool.       History colostomy  Genitourinary: Negative for difficulty urinating and dysuria.  Musculoskeletal: Positive for arthralgias and joint swelling (DIP joints in fingers).  Neurological: Positive for numbness. Negative for syncope and weakness.  Hematological: Negative for adenopathy. Bruises/bleeds easily.  Psychiatric/Behavioral: Negative for dysphoric mood. The patient is nervous/anxious.   All other systems reviewed and are negative.   BP 97/67 (BP Location: Right Arm, Patient Position: Sitting, Cuff Size: Small)   Pulse 67   Resp 20   Ht 5' (1.524 m)   Wt 128 lb (  58.1 kg)   SpO2 99% Comment: RA  BMI 25.00 kg/m  Physical Exam  Constitutional: She is oriented to person, place, and time.  Elderly woman in no acute distress  HENT:  Head: Normocephalic and atraumatic.  Mouth/Throat: No oropharyngeal exudate.  Eyes: Conjunctivae and EOM are normal. No scleral icterus.  Neck: Neck supple. No thyromegaly present.  No carotid bruits  Cardiovascular: Normal rate, regular rhythm and intact distal pulses.   Normal Allen's test on the left. Severe varicosities entire right leg and thigh. Moderately severe varicosities left leg.  Pulmonary/Chest: Effort normal and breath sounds normal. No respiratory distress. She has no wheezes. She has no rales.  Abdominal: Soft. She exhibits no distension. There is no tenderness.  Musculoskeletal: She exhibits edema (1+ bilaterally).       Arms: Lymphadenopathy:    She has no cervical adenopathy.  Neurological: She is alert and oriented to person, place, and time. No cranial nerve  deficit.  No focal motor deficit  Skin: Skin is warm and dry.  Vitals reviewed.  Diagnostic Tests: Stress test Study Result    Defect 1: There is a large defect of severe severity present in the basal inferoseptal, basal inferior, basal inferolateral, mid inferoseptal, mid inferior, mid inferolateral, apical inferior and apical lateral location.  This is a high risk study.  Nuclear stress EF: 21%.  Findings consistent with prior myocardial infarction with mild degree of peri-infarct ischemia primarily in the apical inferolateral walls.  ST-T abnormalities present at rest and throughout study.    Echocardiogram 06/06/2016 Study Conclusions  - Left ventricle: The cavity size was moderately dilated. [place]   thickness was increased in a pattern of moderate LVH. Systolic   function was severely reduced. The estimated ejection fraction   was in the range of 25% to 30%. There is akinesis and scarring of   the inferolateral and inferior myocardium. Doppler parameters are   consistent with abnormal left ventricular relaxation (grade 1   diastolic dysfunction). - Aortic valve: Mildly calcified annulus. Trileaflet; moderately   calcified leaflets. Left coronary cusp mobility was restricted.   There was mild regurgitation. - Mitral valve: Calcified annulus. Mildly calcified leaflets .   There was mild regurgitation. - Left atrium: The atrium was moderately dilated. - Right atrium: Central venous pressure (est): 3 mm Hg. - Tricuspid valve: There was trivial regurgitation. - Pulmonary arteries: Systolic pressure could not be accurately   estimated. - Pericardium, extracardiac: There was no pericardial effusion.  Impressions:  - Moderate LV chamber dilatation with moderate LVH and LVEF 25-30%.   There is akinesis and thinning of the inferior/inferolateral Hari   consistent with ischemic cardiomyopathy. Grade 1 diastolic   dysfunction. Moderate left atrial enlargement. Calcified  mitral   annulus with mild mitral regurgitation. Moderately sclerotic   aortic valve with mild aortic regurgitation. Trivial tricuspid   regurgitation.  Cardiac catheterization 06/12/2016 Conclusion     Ost RCA to Mid RCA lesion, 100 %stenosed. The entire distal RCA system with 2 posterolateral branches and the PDA are filled via collaterals from the LAD septals and diagonal branches  Prox Cx lesion, 100 %stenosed. 2 branches fill via collaterals from diagonal branch  Mid LAD lesion, 70 %stenosed. 1st Diag lesion, 80 %stenosed.  There is severe left ventricular systolic dysfunction as indicated by echocardiogram and Myoview stress test  LV end diastolic pressure is moderately elevated. 25 mmHg  There is no aortic valve stenosis.   Severe multivessel CAD with chronically occluded circumflex and RCA  both filling via collaterals. The LAD itself is a long tubular 70% as well as the diagonal having focal 80%.  Patient's best option is for CT surgical consultation  PLAN: Return to the short stay holding area for TR band removal Discontinue Plavix CT surgery has been consulted. They will try to arrange outpatient consultation either tomorrow or on Monday.   I personally reviewed the echocardiogram and cardiac catheterization images and concur with the findings noted above.  Impression:  Cheryl [name] is an 82 year old woman who presents with atypical angina and acute left-sided systolic congestive heart failure. She's been found to have severe three-vessel coronary disease with severe ischemic cardiomyopathy with ejection fraction from 20-30%. She has totally occluded circumflex and right coronary and a 70% stenosis in her LAD. There also is an 80% stenosis in the first diagonal. LV end diastolic pressure is moderately elevated at 25 mmHg. Her BNP is 1545.  Coronary artery bypass grafting is her best option for treatment. Revascularization percutaneously would be incomplete. We may be  limited to some extent with a complete revascularization due to the possibility of an adequate conduit. She has had a laser ablation and stab phlebectomy of the right greater saphenous vein. It is possible she may have some usable vein in the left thigh and up so that would be helpful. We will do a vein mapping of the left thigh to see whether its worth attempting to harvest. Her Allen's test does appear adequate to tolerate removal of the left radial artery, but we will need to see the vascular lab confirmation of that. We also will likely need to use bilateral mammary arteries. This less than ideal in an 82 year old with osteoporosis, but there may be of no other option.  I discussed the general nature of the procedure, the need for general anesthesia, the use of cardiopulmonary bypass, and incisions to be used with Cheryl [name] and her family (including a daughter from [place] who was on the telephone). I informed them of the expected hospital stay, overall recovery and short and long term outcomes. They understand the high-risk nature of the procedure given her advanced age and severe left ventricular dysfunction. They understand the risks, include but are not limited to death (5-10%), stroke (5-10%), MI, DVT/PE, bleeding, need for transfusion, infections, cardiac arrhythmias, other organ system dysfunction including respiratory or renal failure, or GI complications.   She accepts the risks and agrees to proceed.  Plan: Vein mapping of left leg Carotid duplex Point function testing Coronary bypass grafting with bilateral IMA, possible left radial, possible left greater saphenous vein on Friday, 06/20/2016.  I spent 1 hour with Cheryl [name] during this visit  Loreli SlotSteven C Karin Pinedo, MD [facility] [phone]

## 2016-06-17 ENCOUNTER — Other Ambulatory Visit: Payer: Self-pay | Admitting: *Deleted

## 2016-06-17 DIAGNOSIS — I2510 Atherosclerotic heart disease of native coronary artery without angina pectoris: Secondary | ICD-10-CM

## 2016-06-18 ENCOUNTER — Other Ambulatory Visit: Payer: Self-pay | Admitting: *Deleted

## 2016-06-18 DIAGNOSIS — I2510 Atherosclerotic heart disease of native coronary artery without angina pectoris: Secondary | ICD-10-CM

## 2016-06-19 ENCOUNTER — Encounter (HOSPITAL_COMMUNITY): Payer: Self-pay

## 2016-06-19 ENCOUNTER — Ambulatory Visit (HOSPITAL_BASED_OUTPATIENT_CLINIC_OR_DEPARTMENT_OTHER)
Admission: RE | Admit: 2016-06-19 | Discharge: 2016-06-19 | Disposition: A | Discharge status: Home / Self Care | Payer: Medicare Other | Source: Ambulatory Visit | Attending: Thoracic Surgery (Cardiothoracic Vascular Surgery) | Admitting: Thoracic Surgery (Cardiothoracic Vascular Surgery)

## 2016-06-19 ENCOUNTER — Encounter (HOSPITAL_COMMUNITY)
Admission: RE | Admit: 2016-06-19 | Discharge: 2016-06-19 | Disposition: A | Discharge status: Home / Self Care | Payer: Medicare Other | Source: Ambulatory Visit | Attending: Thoracic Surgery (Cardiothoracic Vascular Surgery) | Admitting: Thoracic Surgery (Cardiothoracic Vascular Surgery)

## 2016-06-19 ENCOUNTER — Other Ambulatory Visit: Payer: Self-pay

## 2016-06-19 ENCOUNTER — Ambulatory Visit (HOSPITAL_COMMUNITY)
Admission: RE | Admit: 2016-06-19 | Discharge: 2016-06-19 | Disposition: A | Discharge status: Home / Self Care | Payer: Medicare Other | Source: Ambulatory Visit | Attending: Thoracic Surgery (Cardiothoracic Vascular Surgery) | Admitting: Thoracic Surgery (Cardiothoracic Vascular Surgery)

## 2016-06-19 ENCOUNTER — Ambulatory Visit: Payer: Medicare Other | Admitting: Cardiovascular Disease

## 2016-06-19 DIAGNOSIS — E871 Hypo-osmolality and hyponatremia: Secondary | ICD-10-CM | POA: Diagnosis not present

## 2016-06-19 DIAGNOSIS — I2510 Atherosclerotic heart disease of native coronary artery without angina pectoris: Secondary | ICD-10-CM | POA: Diagnosis not present

## 2016-06-19 DIAGNOSIS — Z01812 Encounter for preprocedural laboratory examination: Secondary | ICD-10-CM | POA: Insufficient documentation

## 2016-06-19 DIAGNOSIS — Z8673 Personal history of transient ischemic attack (TIA), and cerebral infarction without residual deficits: Secondary | ICD-10-CM | POA: Diagnosis not present

## 2016-06-19 DIAGNOSIS — I25119 Atherosclerotic heart disease of native coronary artery with unspecified angina pectoris: Secondary | ICD-10-CM | POA: Diagnosis not present

## 2016-06-19 DIAGNOSIS — D696 Thrombocytopenia, unspecified: Secondary | ICD-10-CM | POA: Diagnosis not present

## 2016-06-19 DIAGNOSIS — I255 Ischemic cardiomyopathy: Secondary | ICD-10-CM | POA: Diagnosis not present

## 2016-06-19 DIAGNOSIS — N390 Urinary tract infection, site not specified: Secondary | ICD-10-CM | POA: Diagnosis not present

## 2016-06-19 DIAGNOSIS — D62 Acute posthemorrhagic anemia: Secondary | ICD-10-CM | POA: Diagnosis not present

## 2016-06-19 DIAGNOSIS — I5021 Acute systolic (congestive) heart failure: Secondary | ICD-10-CM | POA: Diagnosis not present

## 2016-06-19 DIAGNOSIS — J9811 Atelectasis: Secondary | ICD-10-CM | POA: Diagnosis not present

## 2016-06-19 DIAGNOSIS — J90 Pleural effusion, not elsewhere classified: Secondary | ICD-10-CM | POA: Diagnosis not present

## 2016-06-19 DIAGNOSIS — Z01818 Encounter for other preprocedural examination: Secondary | ICD-10-CM | POA: Insufficient documentation

## 2016-06-19 DIAGNOSIS — I481 Persistent atrial fibrillation: Secondary | ICD-10-CM | POA: Diagnosis not present

## 2016-06-19 DIAGNOSIS — I110 Hypertensive heart disease with heart failure: Secondary | ICD-10-CM | POA: Diagnosis not present

## 2016-06-19 HISTORY — DX: Dyspnea, unspecified: R06.00

## 2016-06-19 HISTORY — DX: Angina pectoris, unspecified: I20.9

## 2016-06-19 HISTORY — DX: Acute myocardial infarction, unspecified: I21.9

## 2016-06-19 HISTORY — DX: Gastro-esophageal reflux disease without esophagitis: K21.9

## 2016-06-19 HISTORY — DX: Chronic obstructive pulmonary disease, unspecified: J44.9

## 2016-06-19 HISTORY — DX: Cerebral infarction, unspecified: I63.9

## 2016-06-19 HISTORY — DX: Hypothyroidism, unspecified: E03.9

## 2016-06-19 HISTORY — DX: Atherosclerotic heart disease of native coronary artery without angina pectoris: I25.10

## 2016-06-19 LAB — VAS US DOPPLER PRE CABG
LCCADDIAS: -18 cm/s
LCCADSYS: -87 cm/s
LCCAPSYS: 70 cm/s
LEFT ECA DIAS: -5 cm/s
LEFT VERTEBRAL DIAS: -11 cm/s
LICADSYS: -101 cm/s
Left CCA prox dias: 11 cm/s
Left ICA dist dias: -24 cm/s
Left ICA prox dias: 29 cm/s
Left ICA prox sys: 79 cm/s
RCCADSYS: -101 cm/s
RCCAPSYS: 32 cm/s
RIGHT ECA DIAS: 2 cm/s
RIGHT VERTEBRAL DIAS: -16 cm/s
Right CCA prox dias: 6 cm/s

## 2016-06-19 LAB — URINALYSIS, ROUTINE W REFLEX MICROSCOPIC
Bilirubin Urine: NEGATIVE
Glucose, UA: NEGATIVE mg/dL
Hgb urine dipstick: NEGATIVE
Ketones, ur: NEGATIVE mg/dL
NITRITE: NEGATIVE
PROTEIN: NEGATIVE mg/dL
Specific Gravity, Urine: 1.008 (ref 1.005–1.030)
pH: 7 (ref 5.0–8.0)

## 2016-06-19 LAB — CBC
HCT: 37.3 % (ref 36.0–46.0)
HEMOGLOBIN: 12.7 g/dL (ref 12.0–15.0)
MCH: 29.3 pg (ref 26.0–34.0)
MCHC: 34 g/dL (ref 30.0–36.0)
MCV: 85.9 fL (ref 78.0–100.0)
Platelets: 208 10*3/uL (ref 150–400)
RBC: 4.34 MIL/uL (ref 3.87–5.11)
RDW: 12.7 % (ref 11.5–15.5)
WBC: 6.8 10*3/uL (ref 4.0–10.5)

## 2016-06-19 LAB — PULMONARY FUNCTION TEST
DL/VA % PRED: 85 %
DL/VA: 3.63 ml/min/mmHg/L
DLCO unc % pred: 71 %
DLCO unc: 13.57 ml/min/mmHg
FEF 25-75 POST: 1.15 L/s
FEF 25-75 Pre: 0.87 L/sec
FEF2575-%Change-Post: 33 %
FEF2575-%Pred-Post: 103 %
FEF2575-%Pred-Pre: 78 %
FEV1-%CHANGE-POST: 9 %
FEV1-%PRED-PRE: 86 %
FEV1-%Pred-Post: 95 %
FEV1-PRE: 1.32 L
FEV1-Post: 1.45 L
FEV1FVC-%Change-Post: -2 %
FEV1FVC-%Pred-Pre: 96 %
FEV6-%Change-Post: 11 %
FEV6-%PRED-PRE: 96 %
FEV6-%Pred-Post: 108 %
FEV6-POST: 2.1 L
FEV6-PRE: 1.88 L
FEV6FVC-%Change-Post: 0 %
FEV6FVC-%PRED-POST: 105 %
FEV6FVC-%PRED-PRE: 106 %
FVC-%Change-Post: 12 %
FVC-%PRED-PRE: 90 %
FVC-%Pred-Post: 101 %
FVC-POST: 2.11 L
FVC-PRE: 1.88 L
POST FEV6/FVC RATIO: 100 %
PRE FEV6/FVC RATIO: 100 %
Post FEV1/FVC ratio: 69 %
Pre FEV1/FVC ratio: 70 %
RV % PRED: 98 %
RV: 2.19 L
TLC % PRED: 98 %
TLC: 4.38 L

## 2016-06-19 LAB — BLOOD GAS, ARTERIAL
Acid-Base Excess: 2.4 mmol/L — ABNORMAL HIGH (ref 0.0–2.0)
Bicarbonate: 25.9 mmol/L (ref 20.0–28.0)
Drawn by: 421801
FIO2: 21
O2 Saturation: 95.1 %
PATIENT TEMPERATURE: 98.6
PH ART: 7.47 — AB (ref 7.350–7.450)
PO2 ART: 74.9 mmHg — AB (ref 83.0–108.0)
pCO2 arterial: 36 mmHg (ref 32.0–48.0)

## 2016-06-19 LAB — COMPREHENSIVE METABOLIC PANEL
ALBUMIN: 4.5 g/dL (ref 3.5–5.0)
ALK PHOS: 45 U/L (ref 38–126)
ALT: 16 U/L (ref 14–54)
AST: 32 U/L (ref 15–41)
Anion gap: 16 — ABNORMAL HIGH (ref 5–15)
BUN: 11 mg/dL (ref 6–20)
CALCIUM: 9.5 mg/dL (ref 8.9–10.3)
CO2: 22 mmol/L (ref 22–32)
CREATININE: 0.8 mg/dL (ref 0.44–1.00)
Chloride: 87 mmol/L — ABNORMAL LOW (ref 101–111)
GFR calc non Af Amer: >60 mL/min (ref >60)
GLUCOSE: 108 mg/dL — AB (ref 65–99)
Potassium: 3 mmol/L — ABNORMAL LOW (ref 3.5–5.1)
SODIUM: 125 mmol/L — AB (ref 135–145)
Total Bilirubin: 0.8 mg/dL (ref 0.3–1.2)
Total Protein: 7.4 g/dL (ref 6.5–8.1)

## 2016-06-19 LAB — PROTIME-INR
INR: 1.09
Prothrombin Time: 14.1 seconds (ref 11.4–15.2)

## 2016-06-19 LAB — URINE MICROSCOPIC-ADD ON
Bacteria, UA: NONE SEEN
RBC / HPF: NONE SEEN RBC/hpf (ref 0–5)

## 2016-06-19 LAB — APTT: aPTT: 30 seconds (ref 24–36)

## 2016-06-19 LAB — SURGICAL PCR SCREEN
MRSA, PCR: POSITIVE — AB
Staphylococcus aureus: POSITIVE — AB

## 2016-06-19 LAB — ABO/RH: ABO/RH(D): O POS

## 2016-06-19 MED ORDER — TRANEXAMIC ACID (OHS) PUMP PRIME SOLUTION
2.0000 mg/kg | INTRAVENOUS | Status: DC
  Filled 2016-06-19: qty 1.16

## 2016-06-19 MED ORDER — DOPAMINE-DEXTROSE 3.2-5 MG/ML-% IV SOLN
0.0000 ug/kg/min | INTRAVENOUS | Status: AC
Start: 2016-06-20 — End: 2016-06-20
  Administered 2016-06-20: 3 ug/kg/min via INTRAVENOUS
  Filled 2016-06-19: qty 250

## 2016-06-19 MED ORDER — LEVOFLOXACIN 500 MG PO TABS: 500.0000 mg | ORAL_TABLET | Freq: Every day | ORAL | 0 refills | Status: DC

## 2016-06-19 MED ORDER — DEXTROSE 5 % IV SOLN
30.0000 ug/min | INTRAVENOUS | Status: AC
  Administered 2016-06-20: 10 ug/min via INTRAVENOUS
  Filled 2016-06-19: qty 2

## 2016-06-19 MED ORDER — POTASSIUM CHLORIDE 2 MEQ/ML IV SOLN
80.0000 meq | INTRAVENOUS | Status: DC
  Filled 2016-06-19: qty 40

## 2016-06-19 MED ORDER — DEXMEDETOMIDINE HCL IN NACL 400 MCG/100ML IV SOLN
0.1000 ug/kg/h | INTRAVENOUS | Status: AC
  Administered 2016-06-20: 0.7 ug/kg/h via INTRAVENOUS
  Filled 2016-06-19: qty 100

## 2016-06-19 MED ORDER — TRANEXAMIC ACID (OHS) BOLUS VIA INFUSION
15.0000 mg/kg | INTRAVENOUS | Status: AC
  Administered 2016-06-20: 868.5 mg via INTRAVENOUS
  Filled 2016-06-19: qty 869

## 2016-06-19 MED ORDER — LEVOFLOXACIN IN D5W 500 MG/100ML IV SOLN
500.0000 mg | INTRAVENOUS | Status: AC
  Administered 2016-06-20: 500 mg via INTRAVENOUS
  Filled 2016-06-19 (×2): qty 100

## 2016-06-19 MED ORDER — MAGNESIUM SULFATE 50 % IJ SOLN
40.0000 meq | INTRAMUSCULAR | Status: DC
  Filled 2016-06-19: qty 10

## 2016-06-19 MED ORDER — SODIUM CHLORIDE 0.9 % IV SOLN
1.5000 mg/kg/h | INTRAVENOUS | Status: AC
  Administered 2016-06-20: 1.5 mg/kg/h via INTRAVENOUS
  Filled 2016-06-19: qty 25

## 2016-06-19 MED ORDER — SODIUM CHLORIDE 0.9 % IV SOLN
INTRAVENOUS | Status: DC
  Filled 2016-06-19: qty 30

## 2016-06-19 MED ORDER — EPINEPHRINE PF 1 MG/ML IJ SOLN
0.0000 ug/min | INTRAVENOUS | Status: DC
  Filled 2016-06-19: qty 4

## 2016-06-19 MED ORDER — NITROGLYCERIN IN D5W 200-5 MCG/ML-% IV SOLN
2.0000 ug/min | INTRAVENOUS | Status: AC
  Administered 2016-06-20: 5 ug/min via INTRAVENOUS
  Filled 2016-06-19: qty 250

## 2016-06-19 MED ORDER — ALBUTEROL SULFATE (2.5 MG/3ML) 0.083% IN NEBU
2.5000 mg | INHALATION_SOLUTION | Freq: Once | RESPIRATORY_TRACT | Status: AC
  Administered 2016-06-19: 2.5 mg via RESPIRATORY_TRACT

## 2016-06-19 MED ORDER — PAPAVERINE HCL 30 MG/ML IJ SOLN
INTRAMUSCULAR | Status: AC
  Administered 2016-06-20: 500 mL
  Filled 2016-06-19: qty 2.5

## 2016-06-19 MED ORDER — SODIUM CHLORIDE 0.9 % IV SOLN
INTRAVENOUS | Status: AC
  Administered 2016-06-20: .8 [IU]/h via INTRAVENOUS
  Filled 2016-06-19: qty 2.5

## 2016-06-19 MED ORDER — VANCOMYCIN HCL 10 G IV SOLR
1250.0000 mg | INTRAVENOUS | Status: AC
  Administered 2016-06-20: 1250 mg via INTRAVENOUS
  Filled 2016-06-19 (×2): qty 1250

## 2016-06-19 NOTE — Progress Notes (Signed)
Left Lower Extremity Vein Map    Left Great Saphenous Vein   2. High Thigh 6.38mm   3. Mid Thigh 3.55mm   4. Low Thigh 3.64mm   5. At Knee 4.99mm   6. High Calf 3.81mm   7. Low Calf 3.57mm Multiple branches with non thrombosed varicosites  8. Ankle 3.47mm       Cheryl [name] RVS 06/19/16 03:55 PM

## 2016-06-19 NOTE — Progress Notes (Addendum)
PCP:Dr. Kari Baars in [place] Cardiologist: Dr. Valentino Nose @[facility]  concerning aspirin the day of surgery. She stated for pt. Not to take day of surgery, Instructed pt.  Notified Violet Baldy of abnormal cmet.Also notified Ryan @ [facility], stated she would inform Dr. Dorris Fetch.

## 2016-06-19 NOTE — Progress Notes (Signed)
Anesthesia Chart Review: Patient is a 82 year old female scheduled for CABG with bilateral IMA and possible left RA harvest on 06/20/16 by Dr. Dorris Fetch. PAT was this morning.  History includes CAD, severe LV dysfunction and evidence of prior MI 05/2016, non-smoker, COPD, TIAs, dyspnea, anxiety, GERD, hypothyroidism, appendectomy, perforated diverticulitis s/p low anterior resection with colostomy 12/13/12 with colostomy takedown '14 (summer), right GSV ablation/stab phlebectomy '08.   PCP is Dr. Kari Baars. Cardiologist is Dr. Eden Emms.  Meds include Fosamax, ASA 81 mg, 65 Fe, Lasix, Lozol, levothyroxine, losartan, Nitro, Protonix, KCl, vitamin C.  BP 140/68   Pulse (!) 58   Temp 36.7 C   Resp 18   Ht 5' (1.524 m)   Wt 127 lb 11.2 oz (57.9 kg)   SpO2 100%   BMI 24.94 kg/m   06/12/16 EKG: NSR, non-specific ST-T changes.  06/12/16 Cardiac cath:  Ost RCA to Mid RCA lesion, 100 %stenosed. The entire distal RCA system with 2 posterolateral branches and the PDA are filled via collaterals from the LAD septals and diagonal branches  Prox Cx lesion, 100 %stenosed. 2 branches fill via collaterals from diagonal branch  Mid LAD lesion, 70 %stenosed. 1st Diag lesion, 80 %stenosed.  There is severe left ventricular systolic dysfunction as indicated by echocardiogram and Myoview stress test  LV end diastolic pressure is moderately elevated. 25 mmHg  There is no aortic valve stenosis.  Severe multivessel CAD with chronically occluded circumflex and RCA both filling via collaterals. The LAD itself is a long tubular 70% as well as the diagonal having focal 80%. Patient's best option is for CT surgical consultation.  06/06/16 Echo: Impressions: - Moderate LV chamber dilatation with moderate LVH and LVEF 25-30%.   There is akinesis and thinning of the inferior/inferolateral [place]   consistent with ischemic cardiomyopathy. Grade 1 diastolic   dysfunction. Moderate left atrial enlargement.  Calcified mitral   annulus with mild mitral regurgitation. Moderately sclerotic   aortic valve with mild aortic regurgitation. Trivial tricuspid   regurgitation.  06/19/16 Carotid U/S (Preliminary): Bilateral:  1-39% ICA stenosis.  Vertebral artery flow is antegrade.  06/19/16 CXR: IMPRESSION: No active cardiopulmonary disease.  06/19/16 PFTs: FVC 1.88 (90%), FEV1 1.32 (86%), DLCOunc 13.57 (71%).  Preoperative labs noted. Na 125 (down from 130 on 06/12/16), K 3.0, Cr 0.80, glucose 108, anion gap 16. CBC, PT/PTT WNL. UA showed large leukocytes, negative nitrites, 6-30 WBCs. NA, K, abnormal UA called to [facility] [employer]. Dr. Dorris Fetch wants electrolytes rechecked on arrival (STAT BMET ordered), and they will start patient on Levaquin this evening.   Surgeon and anesthesiologist to evaluate on arrival and follow-up repeat lab results. Definitive plan at that time.  Velna Ochs [facility] Short Stay Center/Anesthesiology Phone [phone] 06/19/2016 3:26 PM

## 2016-06-19 NOTE — Anesthesia Preprocedure Evaluation (Addendum)
Anesthesia Evaluation  Patient identified by MRN, date of birth, ID band Patient awake    Reviewed: Allergy & Precautions, NPO status , Patient's Chart, lab work & pertinent test results  Airway Mallampati: II  TM Distance: >3 FB Neck ROM: Full    Dental   Pulmonary shortness of breath, COPD,    breath sounds clear to auscultation       Cardiovascular hypertension, Pt. on medications and Pt. on home beta blockers + angina + CAD and + Past MI   Rhythm:Regular Rate:Normal  06/06/16 Echo: Impressions: - Moderate LV chamber dilatation with moderate LVH and LVEF 25-30%. There is akinesis and thinning of the inferior/inferolateral [place] consistent with ischemic cardiomyopathy. Grade 1 diastolic dysfunction. Moderate left atrial enlargement. Calcified mitral annulus with mild mitral regurgitation. Moderately sclerotic aortic valve with mild aortic regurgitation. Trivial tricuspid regurgitation.   Neuro/Psych CVA    GI/Hepatic Neg liver ROS, GERD  ,  Endo/Other  Hypothyroidism   Renal/GU negative Renal ROS     Musculoskeletal  (+) Arthritis ,   Abdominal   Peds  Hematology  (+) anemia ,   Anesthesia Other Findings   Reproductive/Obstetrics                           Lab Results  Component Value Date   WBC 6.8 06/19/2016   HGB 12.7 06/19/2016   HCT 37.3 06/19/2016   MCV 85.9 06/19/2016   PLT 208 06/19/2016   Lab Results  Component Value Date   CREATININE 0.80 06/19/2016   BUN 11 06/19/2016   NA 125 (L) 06/19/2016   K 3.0 (L) 06/19/2016   CL 87 (L) 06/19/2016   CO2 22 06/19/2016   Lab Results  Component Value Date   INR 1.09 06/19/2016   INR 1.10 06/12/2016    Anesthesia Physical Anesthesia Plan  ASA: IV  Anesthesia Plan: General   Post-op Pain Management:    Induction: Intravenous  Airway Management Planned: Oral ETT  Additional Equipment: Arterial line, TEE,  CVP, PA Cath and Ultrasound Guidance Line Placement  Intra-op Plan:   Post-operative Plan: Post-operative intubation/ventilation  Informed Consent: I have reviewed the patients History and Physical, chart, labs and discussed the procedure including the risks, benefits and alternatives for the proposed anesthesia with the patient or authorized representative who has indicated his/her understanding and acceptance.   Dental advisory given  Plan Discussed with:   Anesthesia Plan Comments:        Anesthesia Quick Evaluation

## 2016-06-19 NOTE — Pre-Procedure Instructions (Signed)
Cheryl [name]  06/19/2016      [facility] - [place], [place] - [address] [place] [address] Phone: [phone] Fax: [phone]    Your procedure is scheduled on Friday, November 10th, 2017.  Report to [facility] Admitting at 5:30 A.M.   Call this number if you have problems the morning of surgery:  [phone]   Remember:  Do not eat food or drink liquids after midnight.   Take these medicines the morning of surgery with A SIP OF WATER: Levothyroxine (Synothroid), Pantoprazole (Protonix), Ocean Nasal spray if needed.  Stop taking: NSAIDS, Ibuprofen, Advil, Motrin, BC's, Goody's, Aleve, Naproxen, Fish oil, all herbal medications, and all vitamins.     Do not wear jewelry, make-up or nail polish.  Do not wear lotions, powders, or perfumes, or deoderant.  Do not shave 48 hours prior to surgery.    Do not bring valuables to the hospital.  [facility] is not responsible for any belongings or valuables.  Contacts, dentures or bridgework may not be worn into surgery.  Leave your suitcase in the car.  After surgery it may be brought to your room.  For patients admitted to the hospital, discharge time will be determined by your treatment team.  Patients discharged the day of surgery will not be allowed to drive home.   Special instructions:  Preparing for Surgery.   [place]- Preparing For Surgery  Before surgery, you can play an important role. Because skin is not sterile, your skin needs to be as free of germs as possible. You can reduce the number of germs on your skin by washing with CHG (chlorahexidine gluconate) Soap before surgery.  CHG is an antiseptic cleaner which kills germs and bonds with the skin to continue killing germs even after washing.  Please do not use if you have an allergy to CHG or antibacterial soaps. If your skin becomes reddened/irritated stop using the CHG.  Do not shave (including legs and  underarms) for at least 48 hours prior to first CHG shower. It is OK to shave your face.  Please follow these instructions carefully.   1. Shower the NIGHT BEFORE SURGERY and the MORNING OF SURGERY with CHG.   2. If you chose to wash your hair, wash your hair first as usual with your normal shampoo.  3. After you shampoo, rinse your hair and body thoroughly to remove the shampoo.  4. Use CHG as you would any other liquid soap. You can apply CHG directly to the skin and wash gently with a scrungie or a clean washcloth.   5. Apply the CHG Soap to your body ONLY FROM THE NECK DOWN.  Do not use on open wounds or open sores. Avoid contact with your eyes, ears, mouth and genitals (private parts). Wash genitals (private parts) with your normal soap.  6. Wash thoroughly, paying special attention to the area where your surgery will be performed.  7. Thoroughly rinse your body with warm water from the neck down.  8. DO NOT shower/wash with your normal soap after using and rinsing off the CHG Soap.  9. Pat yourself dry with a CLEAN TOWEL.   10. Wear CLEAN PAJAMAS   11. Place CLEAN SHEETS on your bed the night of your first shower and DO NOT SLEEP WITH PETS.  Day of Surgery: Do not apply any deodorants/lotions. Please wear clean clothes to the hospital/surgery center.  Please read over the following fact sheets that you were given. MRSA Information, Incentive Spirometry

## 2016-06-19 NOTE — Progress Notes (Signed)
Mupirocin Ointment Rx called [facility] in [place] for positive PCR of MRSA and Staph. Pt and daughter notified and understand the need to pick up Rx and start using it tonight.

## 2016-06-19 NOTE — Telephone Encounter (Signed)
RX for Levaquin 500 mg po q day called to [facility]. Surgery is scheduled for tomorrow.

## 2016-06-19 NOTE — Progress Notes (Signed)
Pre-op Cardiac Surgery  Carotid Findings:  Bilateral:  1-39% ICA stenosis.  Vertebral artery flow is antegrade.     Upper Extremity Right Left  Brachial Pressures 153 Triphasic 165 Triphasic  Radial Waveforms Triphasic Triphasic  Ulnar Waveforms Triphasic Triphasic  Palmar Arch (Allen's Test) Normal Abnormal   Findings:  Right Doppler waveforms remained normal with both radial and ulnar compressions. Left - Doppler waveforms remained normal with radial compression and diminished greater than 50% with ulnar compression.    Lower  Extremity Right Left  Dorsalis Pedis Noncompressible - Triphasic 175 Triphasic  Posterior Tibial 161 Biphasic Noncompressible Triphasic  Ankle/Brachial Indices DP N/A 1.06  Ankle / Brachial indices PT 0.92                                           N/A  Findings: There were areas of non compressible arteries bilaterally possibly due to calcification. ABIs which were able to be obtained indicate normal arterial flow at rest  [facility], RVS 06/19/2016 03:17 PM

## 2016-06-20 ENCOUNTER — Inpatient Hospital Stay (HOSPITAL_COMMUNITY): Payer: Medicare Other | Admitting: Vascular Surgery

## 2016-06-20 ENCOUNTER — Encounter (HOSPITAL_COMMUNITY): Payer: Self-pay | Admitting: *Deleted

## 2016-06-20 ENCOUNTER — Inpatient Hospital Stay (HOSPITAL_COMMUNITY): Payer: Medicare Other

## 2016-06-20 ENCOUNTER — Encounter (HOSPITAL_COMMUNITY)
Admission: RE | Disposition: A | Discharge status: Home / Self Care | Payer: Self-pay | Source: Ambulatory Visit | Attending: Thoracic Surgery (Cardiothoracic Vascular Surgery)

## 2016-06-20 ENCOUNTER — Inpatient Hospital Stay (HOSPITAL_COMMUNITY)
Admission: RE | Admit: 2016-06-20 | Discharge: 2016-06-30 | DRG: 235 | Disposition: A | Discharge status: Home / Self Care | Payer: Medicare Other | Source: Ambulatory Visit | Attending: Thoracic Surgery (Cardiothoracic Vascular Surgery) | Admitting: Thoracic Surgery (Cardiothoracic Vascular Surgery)

## 2016-06-20 DIAGNOSIS — I255 Ischemic cardiomyopathy: Secondary | ICD-10-CM | POA: Diagnosis present

## 2016-06-20 DIAGNOSIS — I25118 Atherosclerotic heart disease of native coronary artery with other forms of angina pectoris: Secondary | ICD-10-CM | POA: Diagnosis not present

## 2016-06-20 DIAGNOSIS — E871 Hypo-osmolality and hyponatremia: Secondary | ICD-10-CM | POA: Diagnosis present

## 2016-06-20 DIAGNOSIS — I25110 Atherosclerotic heart disease of native coronary artery with unstable angina pectoris: Secondary | ICD-10-CM | POA: Diagnosis not present

## 2016-06-20 DIAGNOSIS — I25119 Atherosclerotic heart disease of native coronary artery with unspecified angina pectoris: Secondary | ICD-10-CM | POA: Diagnosis present

## 2016-06-20 DIAGNOSIS — D62 Acute posthemorrhagic anemia: Secondary | ICD-10-CM | POA: Diagnosis not present

## 2016-06-20 DIAGNOSIS — I2510 Atherosclerotic heart disease of native coronary artery without angina pectoris: Secondary | ICD-10-CM | POA: Diagnosis not present

## 2016-06-20 DIAGNOSIS — R079 Chest pain, unspecified: Secondary | ICD-10-CM | POA: Diagnosis not present

## 2016-06-20 DIAGNOSIS — I110 Hypertensive heart disease with heart failure: Secondary | ICD-10-CM | POA: Diagnosis present

## 2016-06-20 DIAGNOSIS — J449 Chronic obstructive pulmonary disease, unspecified: Secondary | ICD-10-CM | POA: Diagnosis present

## 2016-06-20 DIAGNOSIS — I4819 Other persistent atrial fibrillation: Secondary | ICD-10-CM

## 2016-06-20 DIAGNOSIS — I509 Heart failure, unspecified: Secondary | ICD-10-CM | POA: Diagnosis not present

## 2016-06-20 DIAGNOSIS — D696 Thrombocytopenia, unspecified: Secondary | ICD-10-CM | POA: Diagnosis not present

## 2016-06-20 DIAGNOSIS — I252 Old myocardial infarction: Secondary | ICD-10-CM

## 2016-06-20 DIAGNOSIS — Z7902 Long term (current) use of antithrombotics/antiplatelets: Secondary | ICD-10-CM | POA: Diagnosis not present

## 2016-06-20 DIAGNOSIS — I5021 Acute systolic (congestive) heart failure: Secondary | ICD-10-CM | POA: Diagnosis present

## 2016-06-20 DIAGNOSIS — F419 Anxiety disorder, unspecified: Secondary | ICD-10-CM | POA: Diagnosis present

## 2016-06-20 DIAGNOSIS — Z79899 Other long term (current) drug therapy: Secondary | ICD-10-CM | POA: Diagnosis not present

## 2016-06-20 DIAGNOSIS — N390 Urinary tract infection, site not specified: Secondary | ICD-10-CM | POA: Diagnosis present

## 2016-06-20 DIAGNOSIS — J9811 Atelectasis: Secondary | ICD-10-CM

## 2016-06-20 DIAGNOSIS — Z7982 Long term (current) use of aspirin: Secondary | ICD-10-CM | POA: Diagnosis not present

## 2016-06-20 DIAGNOSIS — J90 Pleural effusion, not elsewhere classified: Secondary | ICD-10-CM

## 2016-06-20 DIAGNOSIS — R0602 Shortness of breath: Secondary | ICD-10-CM | POA: Diagnosis not present

## 2016-06-20 DIAGNOSIS — K219 Gastro-esophageal reflux disease without esophagitis: Secondary | ICD-10-CM | POA: Diagnosis present

## 2016-06-20 DIAGNOSIS — I481 Persistent atrial fibrillation: Secondary | ICD-10-CM | POA: Diagnosis not present

## 2016-06-20 DIAGNOSIS — D649 Anemia, unspecified: Secondary | ICD-10-CM | POA: Diagnosis not present

## 2016-06-20 DIAGNOSIS — M810 Age-related osteoporosis without current pathological fracture: Secondary | ICD-10-CM | POA: Diagnosis present

## 2016-06-20 DIAGNOSIS — M1990 Unspecified osteoarthritis, unspecified site: Secondary | ICD-10-CM | POA: Diagnosis present

## 2016-06-20 DIAGNOSIS — E876 Hypokalemia: Secondary | ICD-10-CM | POA: Diagnosis present

## 2016-06-20 DIAGNOSIS — Z951 Presence of aortocoronary bypass graft: Secondary | ICD-10-CM

## 2016-06-20 DIAGNOSIS — I340 Nonrheumatic mitral (valve) insufficiency: Secondary | ICD-10-CM | POA: Diagnosis not present

## 2016-06-20 DIAGNOSIS — Z8673 Personal history of transient ischemic attack (TIA), and cerebral infarction without residual deficits: Secondary | ICD-10-CM

## 2016-06-20 DIAGNOSIS — E039 Hypothyroidism, unspecified: Secondary | ICD-10-CM | POA: Diagnosis present

## 2016-06-20 DIAGNOSIS — I080 Rheumatic disorders of both mitral and aortic valves: Secondary | ICD-10-CM | POA: Diagnosis not present

## 2016-06-20 HISTORY — PX: CORONARY ARTERY BYPASS GRAFT: SHX141

## 2016-06-20 HISTORY — PX: RADIAL ARTERY HARVEST: SHX5067

## 2016-06-20 HISTORY — PX: TEE WITHOUT CARDIOVERSION: SHX5443

## 2016-06-20 LAB — POCT I-STAT, CHEM 8
BUN: 10 mg/dL (ref 6–20)
BUN: 8 mg/dL (ref 6–20)
BUN: 6 mg/dL (ref 6–20)
BUN: 9 mg/dL (ref 6–20)
BUN: 6 mg/dL (ref 6–20)
BUN: 6 mg/dL (ref 6–20)
BUN: 6 mg/dL (ref 6–20)
CALCIUM ION: 1.09 mmol/L — AB (ref 1.15–1.40)
CALCIUM ION: 1.13 mmol/L — AB (ref 1.15–1.40)
CALCIUM ION: 1.16 mmol/L (ref 1.15–1.40)
CALCIUM ION: 1.01 mmol/L — AB (ref 1.15–1.40)
CHLORIDE: 88 mmol/L — AB (ref 101–111)
CHLORIDE: 93 mmol/L — AB (ref 101–111)
CHLORIDE: 91 mmol/L — AB (ref 101–111)
CREATININE: 0.3 mg/dL — AB (ref 0.44–1.00)
CREATININE: 0.4 mg/dL — AB (ref 0.44–1.00)
CREATININE: 0.5 mg/dL (ref 0.44–1.00)
Calcium, Ion: 0.77 mmol/L — CL (ref 1.15–1.40)
Calcium, Ion: 0.85 mmol/L — CL (ref 1.15–1.40)
Calcium, Ion: 0.97 mmol/L — ABNORMAL LOW (ref 1.15–1.40)
Chloride: 86 mmol/L — ABNORMAL LOW (ref 101–111)
Chloride: 85 mmol/L — ABNORMAL LOW (ref 101–111)
Chloride: 88 mmol/L — ABNORMAL LOW (ref 101–111)
Chloride: 96 mmol/L — ABNORMAL LOW (ref 101–111)
Creatinine, Ser: 0.5 mg/dL (ref 0.44–1.00)
Creatinine, Ser: 0.6 mg/dL (ref 0.44–1.00)
Creatinine, Ser: 0.5 mg/dL (ref 0.44–1.00)
GLUCOSE: 101 mg/dL — AB (ref 65–99)
GLUCOSE: 115 mg/dL — AB (ref 65–99)
GLUCOSE: 110 mg/dL — AB (ref 65–99)
Glucose, Bld: 93 mg/dL (ref 65–99)
Glucose, Bld: 87 mg/dL (ref 65–99)
Glucose, Bld: 94 mg/dL (ref 65–99)
Glucose, Bld: 100 mg/dL — ABNORMAL HIGH (ref 65–99)
HCT: 33 % — ABNORMAL LOW (ref 36.0–46.0)
HCT: 32 % — ABNORMAL LOW (ref 36.0–46.0)
HEMATOCRIT: 22 % — AB (ref 36.0–46.0)
HEMATOCRIT: 33 % — AB (ref 36.0–46.0)
HEMATOCRIT: 20 % — AB (ref 36.0–46.0)
HEMATOCRIT: 25 % — AB (ref 36.0–46.0)
HEMATOCRIT: 33 % — AB (ref 36.0–46.0)
HEMOGLOBIN: 10.9 g/dL — AB (ref 12.0–15.0)
HEMOGLOBIN: 11.2 g/dL — AB (ref 12.0–15.0)
HEMOGLOBIN: 7.5 g/dL — AB (ref 12.0–15.0)
HEMOGLOBIN: 8.5 g/dL — AB (ref 12.0–15.0)
Hemoglobin: 11.2 g/dL — ABNORMAL LOW (ref 12.0–15.0)
Hemoglobin: 6.8 g/dL — CL (ref 12.0–15.0)
Hemoglobin: 11.2 g/dL — ABNORMAL LOW (ref 12.0–15.0)
POTASSIUM: 2.3 mmol/L — AB (ref 3.5–5.1)
POTASSIUM: 2.6 mmol/L — AB (ref 3.5–5.1)
POTASSIUM: 6.3 mmol/L — AB (ref 3.5–5.1)
POTASSIUM: 3.4 mmol/L — AB (ref 3.5–5.1)
Potassium: 2.7 mmol/L — CL (ref 3.5–5.1)
Potassium: 2.3 mmol/L — CL (ref 3.5–5.1)
Potassium: 2.8 mmol/L — ABNORMAL LOW (ref 3.5–5.1)
SODIUM: 126 mmol/L — AB (ref 135–145)
SODIUM: 129 mmol/L — AB (ref 135–145)
SODIUM: 130 mmol/L — AB (ref 135–145)
SODIUM: 133 mmol/L — AB (ref 135–145)
SODIUM: 130 mmol/L — AB (ref 135–145)
Sodium: 134 mmol/L — ABNORMAL LOW (ref 135–145)
Sodium: 136 mmol/L (ref 135–145)
TCO2: 32 mmol/L (ref 0–100)
TCO2: 28 mmol/L (ref 0–100)
TCO2: 27 mmol/L (ref 0–100)
TCO2: 32 mmol/L (ref 0–100)
TCO2: 28 mmol/L (ref 0–100)
TCO2: 32 mmol/L (ref 0–100)
TCO2: 26 mmol/L (ref 0–100)

## 2016-06-20 LAB — POCT I-STAT EG7
ACID-BASE EXCESS: 1 mmol/L (ref 0.0–2.0)
BICARBONATE: 26.9 mmol/L (ref 20.0–28.0)
CALCIUM ION: 1.17 mmol/L (ref 1.15–1.40)
HEMATOCRIT: 37 % (ref 36.0–46.0)
Hemoglobin: 12.6 g/dL (ref 12.0–15.0)
O2 SAT: 38 %
PH VEN: 7.383 (ref 7.250–7.430)
POTASSIUM: 2.8 mmol/L — AB (ref 3.5–5.1)
SODIUM: 127 mmol/L — AB (ref 135–145)
TCO2: 28 mmol/L (ref 0–100)
pCO2, Ven: 45.2 mmHg (ref 44.0–60.0)
pO2, Ven: 23 mmHg — CL (ref 32.0–45.0)

## 2016-06-20 LAB — POCT I-STAT 3, ART BLOOD GAS (G3+)
ACID-BASE EXCESS: 6 mmol/L — AB (ref 0.0–2.0)
ACID-BASE EXCESS: 4 mmol/L — AB (ref 0.0–2.0)
ACID-BASE EXCESS: 3 mmol/L — AB (ref 0.0–2.0)
Acid-Base Excess: 5 mmol/L — ABNORMAL HIGH (ref 0.0–2.0)
Acid-Base Excess: 10 mmol/L — ABNORMAL HIGH (ref 0.0–2.0)
Acid-Base Excess: 8 mmol/L — ABNORMAL HIGH (ref 0.0–2.0)
BICARBONATE: 32.2 mmol/L — AB (ref 20.0–28.0)
BICARBONATE: 31.9 mmol/L — AB (ref 20.0–28.0)
BICARBONATE: 28.7 mmol/L — AB (ref 20.0–28.0)
BICARBONATE: 29.4 mmol/L — AB (ref 20.0–28.0)
Bicarbonate: 28.7 mmol/L — ABNORMAL HIGH (ref 20.0–28.0)
Bicarbonate: 30 mmol/L — ABNORMAL HIGH (ref 20.0–28.0)
O2 SAT: 100 %
O2 SAT: 100 %
O2 SAT: 100 %
O2 SAT: 99 %
O2 Saturation: 100 %
O2 Saturation: 100 %
PCO2 ART: 36.9 mmHg (ref 32.0–48.0)
PCO2 ART: 34.1 mmHg (ref 32.0–48.0)
PCO2 ART: 39.5 mmHg (ref 32.0–48.0)
PH ART: 7.499 — AB (ref 7.350–7.450)
PH ART: 7.584 — AB (ref 7.350–7.450)
PH ART: 7.515 — AB (ref 7.350–7.450)
PH ART: 7.472 — AB (ref 7.350–7.450)
PO2 ART: 528 mmHg — AB (ref 83.0–108.0)
PO2 ART: 172 mmHg — AB (ref 83.0–108.0)
PO2 ART: 165 mmHg — AB (ref 83.0–108.0)
Patient temperature: 36.9
Patient temperature: 36.9
TCO2: 30 mmol/L (ref 0–100)
TCO2: 33 mmol/L (ref 0–100)
TCO2: 33 mmol/L (ref 0–100)
TCO2: 31 mmol/L (ref 0–100)
TCO2: 30 mmol/L (ref 0–100)
TCO2: 31 mmol/L (ref 0–100)
pCO2 arterial: 40.7 mmHg (ref 32.0–48.0)
pCO2 arterial: 42.7 mmHg (ref 32.0–48.0)
pCO2 arterial: 50.7 mmHg — ABNORMAL HIGH (ref 32.0–48.0)
pH, Arterial: 7.434 (ref 7.350–7.450)
pH, Arterial: 7.371 (ref 7.350–7.450)
pO2, Arterial: 435 mmHg — ABNORMAL HIGH (ref 83.0–108.0)
pO2, Arterial: 400 mmHg — ABNORMAL HIGH (ref 83.0–108.0)
pO2, Arterial: 167 mmHg — ABNORMAL HIGH (ref 83.0–108.0)

## 2016-06-20 LAB — BASIC METABOLIC PANEL
Anion gap: 11 (ref 5–15)
BUN: 11 mg/dL (ref 6–20)
CO2: 28 mmol/L (ref 22–32)
CREATININE: 0.93 mg/dL (ref 0.44–1.00)
Calcium: 9.5 mg/dL (ref 8.9–10.3)
Chloride: 87 mmol/L — ABNORMAL LOW (ref 101–111)
GFR, EST NON AFRICAN AMERICAN: 56 mL/min — AB (ref >60)
Glucose, Bld: 97 mg/dL (ref 65–99)
Potassium: 2.6 mmol/L — CL (ref 3.5–5.1)
SODIUM: 126 mmol/L — AB (ref 135–145)

## 2016-06-20 LAB — HEMOGLOBIN AND HEMATOCRIT, BLOOD
HCT: 21.2 % — ABNORMAL LOW (ref 36.0–46.0)
Hemoglobin: 7.4 g/dL — ABNORMAL LOW (ref 12.0–15.0)

## 2016-06-20 LAB — PREPARE RBC (CROSSMATCH)

## 2016-06-20 LAB — GLUCOSE, CAPILLARY
GLUCOSE-CAPILLARY: 162 mg/dL — AB (ref 65–99)
GLUCOSE-CAPILLARY: 153 mg/dL — AB (ref 65–99)
GLUCOSE-CAPILLARY: 152 mg/dL — AB (ref 65–99)
Glucose-Capillary: 151 mg/dL — ABNORMAL HIGH (ref 65–99)
Glucose-Capillary: 120 mg/dL — ABNORMAL HIGH (ref 65–99)
Glucose-Capillary: 109 mg/dL — ABNORMAL HIGH (ref 65–99)
Glucose-Capillary: 114 mg/dL — ABNORMAL HIGH (ref 65–99)

## 2016-06-20 LAB — CBC
HCT: 29.6 % — ABNORMAL LOW (ref 36.0–46.0)
HEMATOCRIT: 31.8 % — AB (ref 36.0–46.0)
HEMOGLOBIN: 10.9 g/dL — AB (ref 12.0–15.0)
Hemoglobin: 10.1 g/dL — ABNORMAL LOW (ref 12.0–15.0)
MCH: 29.1 pg (ref 26.0–34.0)
MCH: 29.2 pg (ref 26.0–34.0)
MCHC: 34.3 g/dL (ref 30.0–36.0)
MCHC: 34.1 g/dL (ref 30.0–36.0)
MCV: 84.8 fL (ref 78.0–100.0)
MCV: 85.5 fL (ref 78.0–100.0)
Platelets: 111 10*3/uL — ABNORMAL LOW (ref 150–400)
Platelets: 120 10*3/uL — ABNORMAL LOW (ref 150–400)
RBC: 3.75 MIL/uL — AB (ref 3.87–5.11)
RBC: 3.46 MIL/uL — ABNORMAL LOW (ref 3.87–5.11)
RDW: 12.6 % (ref 11.5–15.5)
RDW: 12.9 % (ref 11.5–15.5)
WBC: 6.4 10*3/uL (ref 4.0–10.5)
WBC: 9.6 10*3/uL (ref 4.0–10.5)

## 2016-06-20 LAB — PROTIME-INR
INR: 1.52
PROTHROMBIN TIME: 18.4 s — AB (ref 11.4–15.2)

## 2016-06-20 LAB — HEMOGLOBIN A1C
Hgb A1c MFr Bld: 5.4 % (ref 4.8–5.6)
MEAN PLASMA GLUCOSE: 108 mg/dL

## 2016-06-20 LAB — POCT I-STAT 4, (NA,K, GLUC, HGB,HCT)
GLUCOSE: 130 mg/dL — AB (ref 65–99)
HEMATOCRIT: 32 % — AB (ref 36.0–46.0)
HEMOGLOBIN: 10.9 g/dL — AB (ref 12.0–15.0)
Potassium: 2.5 mmol/L — CL (ref 3.5–5.1)
Sodium: 137 mmol/L (ref 135–145)

## 2016-06-20 LAB — CREATININE, SERUM
CREATININE: 0.63 mg/dL (ref 0.44–1.00)
GFR calc Af Amer: >60 mL/min (ref >60)
GFR calc non Af Amer: >60 mL/min (ref >60)

## 2016-06-20 LAB — PLATELET COUNT: Platelets: 120 10*3/uL — ABNORMAL LOW (ref 150–400)

## 2016-06-20 LAB — MAGNESIUM: MAGNESIUM: 3 mg/dL — AB (ref 1.7–2.4)

## 2016-06-20 LAB — APTT: APTT: 36 s (ref 24–36)

## 2016-06-20 SURGERY — CORONARY ARTERY BYPASS GRAFTING (CABG)
Anesthesia: General | Site: Chest

## 2016-06-20 MED ORDER — INSULIN REGULAR HUMAN 100 UNIT/ML IJ SOLN
INTRAMUSCULAR | Status: DC
  Administered 2016-06-20: 2 [IU]/h via INTRAVENOUS
  Filled 2016-06-20: qty 2.5

## 2016-06-20 MED ORDER — ASPIRIN EC 325 MG PO TBEC
325.0000 mg | DELAYED_RELEASE_TABLET | Freq: Every day | ORAL | Status: DC
  Administered 2016-06-21: 325 mg via ORAL
  Filled 2016-06-20: qty 1

## 2016-06-20 MED ORDER — ETOMIDATE 2 MG/ML IV SOLN
INTRAVENOUS | Status: AC
  Filled 2016-06-20: qty 10

## 2016-06-20 MED ORDER — LACTATED RINGERS IV SOLN
INTRAVENOUS | Status: DC | PRN
Start: 2016-06-20 — End: 2016-06-20
  Administered 2016-06-20: 07:00:00 via INTRAVENOUS

## 2016-06-20 MED ORDER — LACTATED RINGERS IV SOLN: INTRAVENOUS | Status: DC

## 2016-06-20 MED ORDER — METOPROLOL TARTRATE 5 MG/5ML IV SOLN
2.5000 mg | INTRAVENOUS | Status: DC | PRN
Start: 2016-06-20 — End: 2016-06-23
  Administered 2016-06-22 – 2016-06-23 (×2): 2.5 mg via INTRAVENOUS
  Filled 2016-06-20 (×2): qty 5

## 2016-06-20 MED ORDER — CHLORHEXIDINE GLUCONATE 0.12 % MT SOLN
15.0000 mL | Freq: Once | OROMUCOSAL | Status: AC
  Administered 2016-06-20: 15 mL via OROMUCOSAL
  Filled 2016-06-20: qty 15

## 2016-06-20 MED ORDER — PHENYLEPHRINE HCL 10 MG/ML IJ SOLN
INTRAMUSCULAR | Status: DC | PRN
  Administered 2016-06-20 (×2): 80 ug via INTRAVENOUS
  Administered 2016-06-20: 40 ug via INTRAVENOUS

## 2016-06-20 MED ORDER — PROPOFOL 10 MG/ML IV BOLUS
INTRAVENOUS | Status: AC
  Filled 2016-06-20: qty 20

## 2016-06-20 MED ORDER — MORPHINE SULFATE (PF) 2 MG/ML IV SOLN
2.0000 mg | INTRAVENOUS | Status: DC | PRN
  Administered 2016-06-20 – 2016-06-21 (×4): 2 mg via INTRAVENOUS
  Administered 2016-06-23: 4 mg via INTRAVENOUS
  Filled 2016-06-20: qty 2
  Filled 2016-06-20 (×3): qty 1

## 2016-06-20 MED ORDER — MIDAZOLAM HCL 10 MG/2ML IJ SOLN
INTRAMUSCULAR | Status: AC
  Filled 2016-06-20: qty 2

## 2016-06-20 MED ORDER — ACETAMINOPHEN 500 MG PO TABS
1000.0000 mg | ORAL_TABLET | Freq: Four times a day (QID) | ORAL | Status: AC
  Administered 2016-06-21 – 2016-06-25 (×7): 1000 mg via ORAL
  Filled 2016-06-20 (×9): qty 2

## 2016-06-20 MED ORDER — ONDANSETRON HCL 4 MG/2ML IJ SOLN
4.0000 mg | Freq: Four times a day (QID) | INTRAMUSCULAR | Status: DC | PRN
  Administered 2016-06-20 – 2016-06-21 (×2): 4 mg via INTRAVENOUS
  Filled 2016-06-20 (×2): qty 2

## 2016-06-20 MED ORDER — HEPARIN SODIUM (PORCINE) 1000 UNIT/ML IJ SOLN
INTRAMUSCULAR | Status: AC
  Filled 2016-06-20: qty 1

## 2016-06-20 MED ORDER — SODIUM CHLORIDE 0.9 % IV SOLN: 250.0000 mL | INTRAVENOUS | Status: DC

## 2016-06-20 MED ORDER — ORAL CARE MOUTH RINSE
15.0000 mL | Freq: Two times a day (BID) | OROMUCOSAL | Status: DC
  Administered 2016-06-20 – 2016-06-21 (×2): 15 mL via OROMUCOSAL

## 2016-06-20 MED ORDER — MILRINONE LACTATE IN DEXTROSE 20-5 MG/100ML-% IV SOLN
0.3750 ug/kg/min | INTRAVENOUS | Status: DC
  Filled 2016-06-20: qty 100

## 2016-06-20 MED ORDER — FENTANYL CITRATE (PF) 250 MCG/5ML IJ SOLN
INTRAMUSCULAR | Status: AC
  Filled 2016-06-20: qty 25

## 2016-06-20 MED ORDER — PHENYLEPHRINE HCL 10 MG/ML IJ SOLN
0.0000 ug/min | INTRAVENOUS | Status: DC
  Filled 2016-06-20: qty 2

## 2016-06-20 MED ORDER — SODIUM CHLORIDE 0.9 % IJ SOLN
OROMUCOSAL | Status: DC | PRN
  Administered 2016-06-20: 08:00:00 via TOPICAL

## 2016-06-20 MED ORDER — CHLORHEXIDINE GLUCONATE CLOTH 2 % EX PADS
6.0000 | MEDICATED_PAD | Freq: Every day | CUTANEOUS | Status: AC
  Administered 2016-06-20 – 2016-06-24 (×5): 6 via TOPICAL

## 2016-06-20 MED ORDER — POTASSIUM CHLORIDE 10 MEQ/50ML IV SOLN
10.0000 meq | Freq: Once | INTRAVENOUS | Status: AC
  Administered 2016-06-20: 10 meq via INTRAVENOUS

## 2016-06-20 MED ORDER — ASPIRIN 81 MG PO CHEW: 324.0000 mg | CHEWABLE_TABLET | Freq: Every day | ORAL | Status: DC

## 2016-06-20 MED ORDER — SODIUM CHLORIDE 0.9% FLUSH
3.0000 mL | Freq: Two times a day (BID) | INTRAVENOUS | Status: DC
  Administered 2016-06-21 – 2016-06-23 (×5): 3 mL via INTRAVENOUS

## 2016-06-20 MED ORDER — ALBUMIN HUMAN 5 % IV SOLN
250.0000 mL | INTRAVENOUS | Status: AC | PRN
  Administered 2016-06-20 (×3): 250 mL via INTRAVENOUS
  Filled 2016-06-20 (×2): qty 250

## 2016-06-20 MED ORDER — POTASSIUM CHLORIDE 10 MEQ/50ML IV SOLN
10.0000 meq | INTRAVENOUS | Status: AC
  Administered 2016-06-20 (×3): 10 meq via INTRAVENOUS

## 2016-06-20 MED ORDER — FAMOTIDINE IN NACL 20-0.9 MG/50ML-% IV SOLN
20.0000 mg | Freq: Two times a day (BID) | INTRAVENOUS | Status: DC
  Administered 2016-06-20: 20 mg via INTRAVENOUS

## 2016-06-20 MED ORDER — CHLORHEXIDINE GLUCONATE 0.12 % MT SOLN
15.0000 mL | OROMUCOSAL | Status: AC
  Administered 2016-06-20: 15 mL via OROMUCOSAL

## 2016-06-20 MED ORDER — ACETAMINOPHEN 160 MG/5ML PO SOLN
1000.0000 mg | Freq: Four times a day (QID) | ORAL | Status: AC
  Administered 2016-06-23 (×3): 1000 mg
  Filled 2016-06-20 (×3): qty 40.6

## 2016-06-20 MED ORDER — MILRINONE LACTATE IN DEXTROSE 20-5 MG/100ML-% IV SOLN
INTRAVENOUS | Status: DC | PRN
  Administered 2016-06-20: .3 ug/kg/min via INTRAVENOUS

## 2016-06-20 MED ORDER — VANCOMYCIN HCL IN DEXTROSE 1-5 GM/200ML-% IV SOLN
1000.0000 mg | Freq: Once | INTRAVENOUS | Status: AC
  Administered 2016-06-20: 1000 mg via INTRAVENOUS
  Filled 2016-06-20: qty 200

## 2016-06-20 MED ORDER — LEVOTHYROXINE SODIUM 50 MCG PO TABS
50.0000 ug | ORAL_TABLET | Freq: Every day | ORAL | Status: DC
  Administered 2016-06-21 – 2016-06-30 (×10): 50 ug via ORAL
  Filled 2016-06-20 (×10): qty 1

## 2016-06-20 MED ORDER — DOPAMINE-DEXTROSE 3.2-5 MG/ML-% IV SOLN
0.0000 ug/kg/min | INTRAVENOUS | Status: DC
  Filled 2016-06-20: qty 250

## 2016-06-20 MED ORDER — ARTIFICIAL TEARS OP OINT
TOPICAL_OINTMENT | OPHTHALMIC | Status: DC | PRN
  Administered 2016-06-20: 1 via OPHTHALMIC

## 2016-06-20 MED ORDER — PROTAMINE SULFATE 10 MG/ML IV SOLN
INTRAVENOUS | Status: DC | PRN
  Administered 2016-06-20: 140 mg via INTRAVENOUS
  Administered 2016-06-20: 10 mg via INTRAVENOUS

## 2016-06-20 MED ORDER — LACTATED RINGERS IV SOLN
INTRAVENOUS | Status: DC | PRN
  Administered 2016-06-20: 08:00:00 via INTRAVENOUS

## 2016-06-20 MED ORDER — MORPHINE SULFATE (PF) 2 MG/ML IV SOLN
1.0000 mg | INTRAVENOUS | Status: AC | PRN
  Filled 2016-06-20: qty 1

## 2016-06-20 MED ORDER — ROCURONIUM BROMIDE 10 MG/ML (PF) SYRINGE
PREFILLED_SYRINGE | INTRAVENOUS | Status: DC | PRN
  Administered 2016-06-20: 20 mg via INTRAVENOUS
  Administered 2016-06-20 (×2): 50 mg via INTRAVENOUS

## 2016-06-20 MED ORDER — LIDOCAINE 2% (20 MG/ML) 5 ML SYRINGE
INTRAMUSCULAR | Status: DC | PRN
  Administered 2016-06-20: 100 mg via INTRAVENOUS

## 2016-06-20 MED ORDER — SODIUM CHLORIDE 0.9 % IV SOLN
INTRAVENOUS | Status: DC | PRN
  Administered 2016-06-20: 14:00:00 via INTRAVENOUS

## 2016-06-20 MED ORDER — SODIUM CHLORIDE 0.9 % IJ SOLN
INTRAMUSCULAR | Status: DC | PRN
  Administered 2016-06-20: 08:00:00 via TOPICAL

## 2016-06-20 MED ORDER — 0.9 % SODIUM CHLORIDE (POUR BTL) OPTIME
TOPICAL | Status: DC | PRN
  Administered 2016-06-20: 5000 mL

## 2016-06-20 MED ORDER — FENTANYL CITRATE (PF) 250 MCG/5ML IJ SOLN
INTRAMUSCULAR | Status: DC | PRN
  Administered 2016-06-20: 100 ug via INTRAVENOUS
  Administered 2016-06-20: 150 ug via INTRAVENOUS
  Administered 2016-06-20 (×3): 100 ug via INTRAVENOUS
  Administered 2016-06-20: 50 ug via INTRAVENOUS
  Administered 2016-06-20 (×2): 100 ug via INTRAVENOUS
  Administered 2016-06-20: 450 ug via INTRAVENOUS

## 2016-06-20 MED ORDER — METOPROLOL TARTRATE 12.5 MG HALF TABLET
12.5000 mg | ORAL_TABLET | Freq: Two times a day (BID) | ORAL | Status: DC
  Administered 2016-06-20 – 2016-06-24 (×7): 12.5 mg via ORAL
  Filled 2016-06-20 (×8): qty 1

## 2016-06-20 MED ORDER — SODIUM CHLORIDE 0.45 % IV SOLN
INTRAVENOUS | Status: DC | PRN
  Administered 2016-06-20: 15:00:00 via INTRAVENOUS

## 2016-06-20 MED ORDER — TRAMADOL HCL 50 MG PO TABS
50.0000 mg | ORAL_TABLET | ORAL | Status: DC | PRN
  Administered 2016-06-21 – 2016-06-22 (×2): 50 mg via ORAL
  Administered 2016-06-23: 100 mg via ORAL
  Administered 2016-06-24 – 2016-06-25 (×2): 50 mg via ORAL
  Filled 2016-06-20 (×2): qty 1
  Filled 2016-06-20: qty 2
  Filled 2016-06-20: qty 1
  Filled 2016-06-20: qty 2

## 2016-06-20 MED ORDER — DEXMEDETOMIDINE HCL IN NACL 200 MCG/50ML IV SOLN: 0.0000 ug/kg/h | INTRAVENOUS | Status: DC

## 2016-06-20 MED ORDER — DOCUSATE SODIUM 100 MG PO CAPS
200.0000 mg | ORAL_CAPSULE | Freq: Every day | ORAL | Status: DC
  Administered 2016-06-21 – 2016-06-30 (×5): 200 mg via ORAL
  Filled 2016-06-20 (×6): qty 2

## 2016-06-20 MED ORDER — METOPROLOL TARTRATE 25 MG/10 ML ORAL SUSPENSION
12.5000 mg | Freq: Two times a day (BID) | ORAL | Status: DC
  Administered 2016-06-22: 12.5 mg
  Filled 2016-06-20: qty 5

## 2016-06-20 MED ORDER — ACETAMINOPHEN 650 MG RE SUPP
650.0000 mg | Freq: Once | RECTAL | Status: AC
  Administered 2016-06-20: 650 mg via RECTAL

## 2016-06-20 MED ORDER — SODIUM CHLORIDE 0.9% FLUSH: 3.0000 mL | INTRAVENOUS | Status: DC | PRN

## 2016-06-20 MED ORDER — ACETAMINOPHEN 160 MG/5ML PO SOLN: 650.0000 mg | Freq: Once | ORAL | Status: AC

## 2016-06-20 MED ORDER — MIDAZOLAM HCL 5 MG/5ML IJ SOLN
INTRAMUSCULAR | Status: DC | PRN
  Administered 2016-06-20: 2 mg via INTRAVENOUS
  Administered 2016-06-20: 3 mg via INTRAVENOUS
  Administered 2016-06-20 (×2): 2 mg via INTRAVENOUS
  Administered 2016-06-20: 1 mg via INTRAVENOUS

## 2016-06-20 MED ORDER — HEMOSTATIC AGENTS (NO CHARGE) OPTIME
TOPICAL | Status: DC | PRN
  Administered 2016-06-20: 1 via TOPICAL

## 2016-06-20 MED ORDER — METOPROLOL TARTRATE 12.5 MG HALF TABLET: 12.5000 mg | ORAL_TABLET | Freq: Once | ORAL | Status: DC

## 2016-06-20 MED ORDER — PANTOPRAZOLE SODIUM 40 MG PO TBEC
40.0000 mg | DELAYED_RELEASE_TABLET | Freq: Every day | ORAL | Status: DC
  Administered 2016-06-22 – 2016-06-30 (×9): 40 mg via ORAL
  Filled 2016-06-20 (×9): qty 1

## 2016-06-20 MED ORDER — MILRINONE LACTATE IN DEXTROSE 20-5 MG/100ML-% IV SOLN
0.3000 ug/kg/min | INTRAVENOUS | Status: DC
  Administered 2016-06-21 (×2): 0.3 ug/kg/min via INTRAVENOUS
  Filled 2016-06-20 (×2): qty 100

## 2016-06-20 MED ORDER — CHLORHEXIDINE GLUCONATE 4 % EX LIQD: 30.0000 mL | CUTANEOUS | Status: DC

## 2016-06-20 MED ORDER — BISACODYL 5 MG PO TBEC
10.0000 mg | DELAYED_RELEASE_TABLET | Freq: Every day | ORAL | Status: DC
  Administered 2016-06-21 – 2016-06-30 (×3): 10 mg via ORAL
  Filled 2016-06-20 (×5): qty 2

## 2016-06-20 MED ORDER — HEPARIN SODIUM (PORCINE) 1000 UNIT/ML IJ SOLN
INTRAMUSCULAR | Status: DC | PRN
  Administered 2016-06-20: 13000 [IU] via INTRAVENOUS
  Administered 2016-06-20: 2000 [IU] via INTRAVENOUS

## 2016-06-20 MED ORDER — ALBUMIN HUMAN 5 % IV SOLN
INTRAVENOUS | Status: DC | PRN
  Administered 2016-06-20: 14:00:00 via INTRAVENOUS

## 2016-06-20 MED ORDER — INSULIN REGULAR BOLUS VIA INFUSION
0.0000 [IU] | Freq: Three times a day (TID) | INTRAVENOUS | Status: DC
  Filled 2016-06-20: qty 10

## 2016-06-20 MED ORDER — PHENYLEPHRINE 40 MCG/ML (10ML) SYRINGE FOR IV PUSH (FOR BLOOD PRESSURE SUPPORT)
PREFILLED_SYRINGE | INTRAVENOUS | Status: AC
  Filled 2016-06-20: qty 10

## 2016-06-20 MED ORDER — ROCURONIUM BROMIDE 10 MG/ML (PF) SYRINGE
PREFILLED_SYRINGE | INTRAVENOUS | Status: AC
  Filled 2016-06-20: qty 10

## 2016-06-20 MED ORDER — MAGNESIUM SULFATE 4 GM/100ML IV SOLN
4.0000 g | Freq: Once | INTRAVENOUS | Status: AC
  Administered 2016-06-20: 4 g via INTRAVENOUS
  Filled 2016-06-20: qty 100

## 2016-06-20 MED ORDER — OXYCODONE HCL 5 MG PO TABS
5.0000 mg | ORAL_TABLET | ORAL | Status: DC | PRN
  Administered 2016-06-21: 10 mg via ORAL
  Administered 2016-06-21: 5 mg via ORAL
  Administered 2016-06-21: 10 mg via ORAL
  Administered 2016-06-22 – 2016-06-23 (×3): 5 mg via ORAL
  Filled 2016-06-20: qty 2
  Filled 2016-06-20 (×3): qty 1
  Filled 2016-06-20: qty 2
  Filled 2016-06-20: qty 1

## 2016-06-20 MED ORDER — MIDAZOLAM HCL 2 MG/2ML IJ SOLN: 2.0000 mg | INTRAMUSCULAR | Status: DC | PRN

## 2016-06-20 MED ORDER — PROPOFOL 10 MG/ML IV BOLUS
INTRAVENOUS | Status: DC | PRN
  Administered 2016-06-20: 20 mg via INTRAVENOUS

## 2016-06-20 MED ORDER — MUPIROCIN 2 % EX OINT
1.0000 "application " | TOPICAL_OINTMENT | Freq: Two times a day (BID) | CUTANEOUS | Status: AC
  Administered 2016-06-20 – 2016-06-25 (×10): 1 via NASAL
  Filled 2016-06-20 (×2): qty 22

## 2016-06-20 MED ORDER — POTASSIUM CHLORIDE 10 MEQ/50ML IV SOLN
10.0000 meq | INTRAVENOUS | Status: AC | PRN
  Administered 2016-06-20 – 2016-06-21 (×3): 10 meq via INTRAVENOUS
  Filled 2016-06-20 (×3): qty 50

## 2016-06-20 MED ORDER — SODIUM CHLORIDE 0.9 % IV SOLN
INTRAVENOUS | Status: DC
  Administered 2016-06-20 (×2): via INTRAVENOUS

## 2016-06-20 MED ORDER — NITROGLYCERIN IN D5W 200-5 MCG/ML-% IV SOLN: 7.0000 ug/min | INTRAVENOUS | Status: DC

## 2016-06-20 MED ORDER — PROTAMINE SULFATE 10 MG/ML IV SOLN
INTRAVENOUS | Status: AC
  Filled 2016-06-20: qty 25

## 2016-06-20 MED ORDER — SUCCINYLCHOLINE CHLORIDE 200 MG/10ML IV SOSY
PREFILLED_SYRINGE | INTRAVENOUS | Status: DC | PRN
  Administered 2016-06-20: 100 mg via INTRAVENOUS

## 2016-06-20 MED ORDER — ARTIFICIAL TEARS OP OINT
TOPICAL_OINTMENT | OPHTHALMIC | Status: AC
  Filled 2016-06-20: qty 3.5

## 2016-06-20 MED ORDER — LACTATED RINGERS IV SOLN: 500.0000 mL | Freq: Once | INTRAVENOUS | Status: DC | PRN

## 2016-06-20 MED ORDER — BISACODYL 10 MG RE SUPP: 10.0000 mg | Freq: Every day | RECTAL | Status: DC

## 2016-06-20 MED FILL — Heparin Sodium (Porcine) Inj 1000 Unit/ML: INTRAMUSCULAR | Qty: 10 | Status: AC

## 2016-06-20 MED FILL — Electrolyte-R (PH 7.4) Solution: INTRAVENOUS | Qty: 5000 | Status: AC

## 2016-06-20 MED FILL — Sodium Bicarbonate IV Soln 8.4%: INTRAVENOUS | Qty: 100 | Status: AC

## 2016-06-20 MED FILL — Mannitol IV Soln 20%: INTRAVENOUS | Qty: 500 | Status: AC

## 2016-06-20 MED FILL — Magnesium Sulfate Inj 50%: INTRAMUSCULAR | Qty: 10 | Status: AC

## 2016-06-20 MED FILL — Sodium Chloride IV Soln 0.9%: INTRAVENOUS | Qty: 2000 | Status: AC

## 2016-06-20 MED FILL — Potassium Chloride Inj 2 mEq/ML: INTRAVENOUS | Qty: 40 | Status: AC

## 2016-06-20 MED FILL — Heparin Sodium (Porcine) Inj 1000 Unit/ML: INTRAMUSCULAR | Qty: 30 | Status: AC

## 2016-06-20 MED FILL — Lidocaine HCl IV Inj 20 MG/ML: INTRAVENOUS | Qty: 5 | Status: AC

## 2016-06-20 SURGICAL SUPPLY — 116 items
APPLIER CLIP 9.375 SM OPEN (CLIP)
APR CLP SM 9.3 20 MLT OPN (CLIP)
BAG DECANTER FOR FLEXI CONT (MISCELLANEOUS) ×4 IMPLANT
BANDAGE ACE 4X5 VEL STRL LF (GAUZE/BANDAGES/DRESSINGS) ×5 IMPLANT
BANDAGE ACE 6X5 VEL STRL LF (GAUZE/BANDAGES/DRESSINGS) ×4 IMPLANT
BASKET HEART (ORDER IN 25'S) (MISCELLANEOUS) ×1
BASKET HEART (ORDER IN 25S) (MISCELLANEOUS) ×3 IMPLANT
BLADE STERNUM SYSTEM 6 (BLADE) ×4 IMPLANT
BLADE SURG 15 STRL LF DISP TIS (BLADE) IMPLANT
BLADE SURG 15 STRL SS (BLADE)
BNDG GAUZE ELAST 4 BULKY (GAUZE/BANDAGES/DRESSINGS) ×5 IMPLANT
CANISTER SUCTION 2500CC (MISCELLANEOUS) ×4 IMPLANT
CANNULA EZ GLIDE AORTIC 21FR (CANNULA) ×4 IMPLANT
CATH CPB KIT HENDRICKSON (MISCELLANEOUS) ×4 IMPLANT
CATH ROBINSON RED A/P 18FR (CATHETERS) ×4 IMPLANT
CATH THORACIC 36FR (CATHETERS) ×4 IMPLANT
CATH THORACIC 36FR RT ANG (CATHETERS) ×4 IMPLANT
CLIP APPLIE 9.375 SM OPEN (CLIP) IMPLANT
CLIP FOGARTY SPRING 6M (CLIP) ×2 IMPLANT
CLIP TI MEDIUM 24 (CLIP) ×1 IMPLANT
CLIP TI WIDE RED SMALL 24 (CLIP) ×6 IMPLANT
COVER BACK TABLE 60X90IN (DRAPES) ×1 IMPLANT
COVER MAYO STAND STRL (DRAPES) ×1 IMPLANT
CRADLE DONUT ADULT HEAD (MISCELLANEOUS) ×4 IMPLANT
DRAPE CARDIOVASCULAR INCISE (DRAPES) ×4
DRAPE EXTREMITY T 121X128X90 (DRAPE) ×1 IMPLANT
DRAPE HALF SHEET 40X57 (DRAPES) ×2 IMPLANT
DRAPE PROXIMA HALF (DRAPES) IMPLANT
DRAPE SLUSH/WARMER DISC (DRAPES) ×4 IMPLANT
DRAPE SRG 135X102X78XABS (DRAPES) ×3 IMPLANT
DRSG AQUACEL AG ADV 3.5X14 (GAUZE/BANDAGES/DRESSINGS) ×1 IMPLANT
DRSG COVADERM 4X14 (GAUZE/BANDAGES/DRESSINGS) ×4 IMPLANT
ELECT REM PT RETURN 9FT ADLT (ELECTROSURGICAL) ×8
ELECTRODE REM PT RTRN 9FT ADLT (ELECTROSURGICAL) ×6 IMPLANT
FELT TEFLON 1X6 (MISCELLANEOUS) ×8 IMPLANT
GAUZE SPONGE 4X4 12PLY STRL (GAUZE/BANDAGES/DRESSINGS) ×11 IMPLANT
GEL ULTRASOUND 20GR AQUASONIC (MISCELLANEOUS) IMPLANT
GLOVE BIOGEL M 6.5 STRL (GLOVE) ×3 IMPLANT
GLOVE BIOGEL M STER SZ 6 (GLOVE) ×6 IMPLANT
GLOVE BIOGEL M STRL SZ7.5 (GLOVE) ×2 IMPLANT
GLOVE BIOGEL PI IND STRL 6 (GLOVE) ×4 IMPLANT
GLOVE BIOGEL PI IND STRL 6.5 (GLOVE) ×6 IMPLANT
GLOVE BIOGEL PI INDICATOR 6 (GLOVE) ×2
GLOVE BIOGEL PI INDICATOR 6.5 (GLOVE) ×3
GLOVE SURG SIGNA 7.5 PF LTX (GLOVE) ×18 IMPLANT
GOWN STRL REUS W/ TWL LRG LVL3 (GOWN DISPOSABLE) ×12 IMPLANT
GOWN STRL REUS W/ TWL XL LVL3 (GOWN DISPOSABLE) ×7 IMPLANT
GOWN STRL REUS W/TWL LRG LVL3 (GOWN DISPOSABLE) ×32
GOWN STRL REUS W/TWL XL LVL3 (GOWN DISPOSABLE) ×12
HARMONIC SHEARS 14CM COAG (MISCELLANEOUS) ×2 IMPLANT
HEMOSTAT POWDER SURGIFOAM 1G (HEMOSTASIS) ×12 IMPLANT
HEMOSTAT SURGICEL 2X14 (HEMOSTASIS) ×4 IMPLANT
INSERT FOGARTY XLG (MISCELLANEOUS) IMPLANT
KIT BASIN OR (CUSTOM PROCEDURE TRAY) ×4 IMPLANT
KIT ROOM TURNOVER OR (KITS) ×4 IMPLANT
KIT SUCTION CATH 14FR (SUCTIONS) ×8 IMPLANT
KIT VASOVIEW HEMOPRO VH 3000 (KITS) ×4 IMPLANT
MARKER GRAFT CORONARY BYPASS (MISCELLANEOUS) ×12 IMPLANT
NS IRRIG 1000ML POUR BTL (IV SOLUTION) ×20 IMPLANT
PACK OPEN HEART (CUSTOM PROCEDURE TRAY) ×4 IMPLANT
PAD ARMBOARD 7.5X6 YLW CONV (MISCELLANEOUS) ×8 IMPLANT
PAD ELECT DEFIB RADIOL ZOLL (MISCELLANEOUS) ×4 IMPLANT
PENCIL BUTTON HOLSTER BLD 10FT (ELECTRODE) ×5 IMPLANT
PUNCH AORTIC ROTATE  4.5MM 8IN (MISCELLANEOUS) ×2 IMPLANT
PUNCH AORTIC ROTATE 4.0MM (MISCELLANEOUS) IMPLANT
PUNCH AORTIC ROTATE 4.5MM 8IN (MISCELLANEOUS) IMPLANT
PUNCH AORTIC ROTATE 5MM 8IN (MISCELLANEOUS) IMPLANT
SET CARDIOPLEGIA MPS 5001102 (MISCELLANEOUS) ×2 IMPLANT
SHEARS HARMONIC 9CM CVD (BLADE) ×2 IMPLANT
SPONGE LAP 18X18 X RAY DECT (DISPOSABLE) ×3 IMPLANT
SUT BONE WAX W31G (SUTURE) ×4 IMPLANT
SUT ETHIBOND 2 0 SH (SUTURE) ×16
SUT ETHIBOND 2 0 SH 36X2 (SUTURE) ×8 IMPLANT
SUT MNCRL AB 4-0 PS2 18 (SUTURE) IMPLANT
SUT PROLENE 3 0 SH DA (SUTURE) ×4 IMPLANT
SUT PROLENE 4 0 RB 1 (SUTURE) ×8
SUT PROLENE 4 0 SH DA (SUTURE) IMPLANT
SUT PROLENE 4-0 RB1 .5 CRCL 36 (SUTURE) ×2 IMPLANT
SUT PROLENE 5 0 C 1 36 (SUTURE) ×4 IMPLANT
SUT PROLENE 6 0 C 1 30 (SUTURE) ×11 IMPLANT
SUT PROLENE 7 0 BV1 MDA (SUTURE) ×13 IMPLANT
SUT PROLENE 8 0 BV175 6 (SUTURE) ×5 IMPLANT
SUT SILK  1 MH (SUTURE) ×3
SUT SILK 1 MH (SUTURE) IMPLANT
SUT SILK 1 TIES 10X30 (SUTURE) ×3 IMPLANT
SUT SILK 2 0 SH CR/8 (SUTURE) ×2 IMPLANT
SUT SILK 2 0 TIES 10X30 (SUTURE) ×2 IMPLANT
SUT SILK 2 0 TIES 17X18 (SUTURE) ×4
SUT SILK 2-0 18XBRD TIE BLK (SUTURE) ×2 IMPLANT
SUT SILK 3 0 SH CR/8 (SUTURE) ×2 IMPLANT
SUT SILK 4 0 TIE 10X30 (SUTURE) ×2 IMPLANT
SUT STEEL 6MS V (SUTURE) ×4 IMPLANT
SUT STEEL STERNAL CCS#1 18IN (SUTURE) IMPLANT
SUT STEEL SZ 6 DBL 3X14 BALL (SUTURE) ×4 IMPLANT
SUT TEM PAC WIRE 2 0 SH (SUTURE) ×4 IMPLANT
SUT VIC AB 1 CTX 36 (SUTURE) ×8
SUT VIC AB 1 CTX36XBRD ANBCTR (SUTURE) ×6 IMPLANT
SUT VIC AB 2-0 CT1 27 (SUTURE) ×12
SUT VIC AB 2-0 CT1 TAPERPNT 27 (SUTURE) IMPLANT
SUT VIC AB 2-0 CTX 27 (SUTURE) ×2 IMPLANT
SUT VIC AB 3-0 SH 27 (SUTURE)
SUT VIC AB 3-0 SH 27X BRD (SUTURE) IMPLANT
SUT VIC AB 3-0 X1 27 (SUTURE) ×8 IMPLANT
SUT VICRYL 4-0 PS2 18IN ABS (SUTURE) IMPLANT
SUTURE E-PAK OPEN HEART (SUTURE) ×4 IMPLANT
SYR 50ML SLIP (SYRINGE) IMPLANT
SYSTEM SAHARA CHEST DRAIN ATS (WOUND CARE) ×4 IMPLANT
TAPE CLOTH SURG 4X10 WHT LF (GAUZE/BANDAGES/DRESSINGS) ×2 IMPLANT
TAPE PAPER 2X10 WHT MICROPORE (GAUZE/BANDAGES/DRESSINGS) ×3 IMPLANT
TOWEL OR 17X24 6PK STRL BLUE (TOWEL DISPOSABLE) ×8 IMPLANT
TOWEL OR 17X26 10 PK STRL BLUE (TOWEL DISPOSABLE) ×8 IMPLANT
TRAY FOLEY IC TEMP SENS 16FR (CATHETERS) ×4 IMPLANT
TUBE FEEDING 8FR 16IN STR KANG (MISCELLANEOUS) ×4 IMPLANT
TUBING INSUFFLATION (TUBING) ×4 IMPLANT
UNDERPAD 30X30 (UNDERPADS AND DIAPERS) ×4 IMPLANT
WATER STERILE IRR 1000ML POUR (IV SOLUTION) ×8 IMPLANT

## 2016-06-20 NOTE — Transfer of Care (Signed)
Immediate Anesthesia Transfer of Care Note  Patient: Cheryl [name]  Procedure(s) Performed: Procedure(s): CORONARY ARTERY BYPASS GRAFTING (CABG)x4 EVH LEFT GREATER SAPHENOUS VEIN  LIMA-LAD SVG-PD SVG-DIAD L RADIAL-OM (N/A) RADIAL ARTERY HARVEST (Left) TRANSESOPHAGEAL ECHOCARDIOGRAM (TEE) (N/A)  Patient Location: SICU  Anesthesia Type:General  Level of Consciousness: Patient remains intubated per anesthesia plan  Airway & Oxygen Therapy: Patient remains intubated per anesthesia plan and Patient placed on Ventilator (see vital sign flow sheet for setting)  Post-op Assessment: Report given to RN and Post -op Vital signs reviewed and stable  Post vital signs: Reviewed and stable  Last Vitals:  Vitals:   06/20/16 0605  BP: 132/61  Pulse: (!) 58  Resp: 18  Temp: 36.7 C    Last Pain:  Vitals:   06/20/16 0605  TempSrc: Oral      Patients Stated Pain Goal: 5 (06/20/16 0559)  Complications: No apparent anesthesia complications

## 2016-06-20 NOTE — H&P (View-Only) (Signed)
PCP is Cheryl Maudlin, MD Referring Provider is Marykay Lex, MD  Chief Complaint  Patient presents with  . Coronary Artery Disease    Surgical eval, Cardiac Cath 06/12/16, ECHO 06/06/16    HPI: Cheryl [name] is an 82 year old woman who is sent for consultation regarding three-vessel coronary disease.  Cheryl [name] is an 82 year old woman with a past medical history significant for hypertension, TIA, osteoporosis, arthritis, gastroesophageal reflux, and diverticulitis requiring colostomy and then colostomy takedown. She had no history of CAD known to her, although she says she did have a catheterization many years ago. She recently developed pain in her anterior left chest. This was relatively short-lived and she began having episodes of subscapular pain on the left side. This is not necessarily related to exertion. She saw Dr. Juanetta Gosling. EKG showed changes. She was referred to Dr. Eden Emms. He did a stress Myoview which showed a large severe defect consistent with an old myocardial infarction with peri-infarct ischemia. An echocardiogram showed severe left ventricular dysfunction with ejection fraction of 25%. There was some sclerosis but no stenosis of the aortic valve. Dr. Herbie Baltimore performed cardiac catheterization which revealed severe three-vessel coronary disease with total occlusion of the right coronary and circumflex and a 70% stenosis in the LAD with an 80% stenosis in a large first diagonal. There was no significant gradient across the aortic valve.  She has continued to have subscapular pain intermittently and sporadically. She had a brief episode last night that lasted only minutes. She does complain of shortness of breath when she tries to lay flat and has been sleeping in a recliner. She has developed numbness in her feet over the past 3 weeks. She and her daughter thinks this is related to starting furosemide.  She had a possible TIA many years ago. She has been on clopidogrel for that. She  has not had any neurologic symptoms recently other than the numbness in her feet. Her clopidogrel was stopped after her catheterization.  She had laser ablation/stab phlebectomy of her right greater saphenous vein and tributaries by Dr. Arbie Cookey in 2008  She does remain fairly independent and lives by herself. She does drive to the grocery store and take care of her own household. Past Medical History:  Diagnosis Date  . Anxiety   . Bulging discs   . Degenerative disc disease   . Diverticulitis    complicated, with microperforation, sigmoid stricture  . Hypertension   . Tuberculosis     Past Surgical History:  Procedure Laterality Date  . APPENDECTOMY    . CARDIAC CATHETERIZATION N/A 06/12/2016   Procedure: Left Heart Cath and Coronary Angiography;  Surgeon: Marykay Lex, MD;  Location[facility] CV LAB;  Service: Cardiovascular;  Laterality: N/A;  . COCCYX REMOVAL    . COLONOSCOPY  June 2014   Dr. Aliene Beams in [place]: via colostomy and rectum. scattered diverticula. no obvious polyps. Hemorrhoids noted in anal canal. Exam limited due to poor prep  . COLOSTOMY  Dec 13, 2012   Dec 13, 2012 placed. Reversed   . ESOPHAGOGASTRODUODENOSCOPY N/A 07/01/2013   Procedure: ESOPHAGOGASTRODUODENOSCOPY (EGD);  Surgeon: West Bali, MD;  Location: [facility] SU[facility];  Service: Endoscopy;  Laterality: N/A;  3:00PM  . FLEXIBLE SIGMOIDOSCOPY  December 08, 2012   Dr. Kerby Less in [place]: very edematous sigmoid colon, unable to advance scope past 25-30 cm despite attempt with peds scope.   Marland Kitchen KNEE ARTHROSCOPY Right   . LAPAROSCOPIC LOW ANTERIOR RESECTION  summer 2014  with reversal of colostomy  . LAPAROSCOPY  May 2014    Family History  Problem Relation Age of Onset  . Heart disease Mother   . Stroke Father   . Heart disease Sister   . Colon cancer Neg Hx     Social History Social History  Substance Use Topics  . Smoking status: Never Smoker  . Smokeless tobacco: Never Used  .  Alcohol use Yes     Comment: occasional    Current Outpatient Prescriptions  Medication Sig Dispense Refill  . alendronate (FOSAMAX) 70 MG tablet Take 70 mg by mouth once a week. Takes on Sunday    . aspirin EC 81 MG tablet Take 81 mg by mouth daily.    . cholecalciferol (VITAMIN D) 1000 UNITS tablet Take 1,000 Units by mouth daily.    . ferrous sulfate 325 (65 FE) MG tablet Take 325 mg by mouth daily with breakfast.    . furosemide (LASIX) 20 MG tablet Take 1 tablet (20 mg total) by mouth daily. (Patient taking differently: Take 40 mg by mouth daily. ) 90 tablet 3  . indapamide (LOZOL) 1.25 MG tablet Take 1.25 mg by mouth daily.    Marland Kitchen levothyroxine (SYNTHROID, LEVOTHROID) 50 MCG tablet Take 50 mcg by mouth daily before breakfast.     . losartan (COZAAR) 25 MG tablet Take 1 tablet (25 mg total) by mouth daily. 90 tablet 3  . pantoprazole (PROTONIX) 40 MG tablet Take 40 mg by mouth daily.    . potassium chloride (MICRO-K) 10 MEQ CR capsule Take 10 mEq by mouth 2 (two) times daily.     . sodium chloride (OCEAN) 0.65 % SOLN nasal spray Place 1 spray into both nostrils as needed for congestion.    . vitamin C (ASCORBIC ACID) 500 MG tablet Take 500 mg by mouth daily.     No current facility-administered medications for this visit.     Allergies  Allergen Reactions  . Penicillins Hives    Has patient had a PCN reaction causing immediate rash, facial/tongue/throat swelling, SOB or lightheadedness with hypotension: Yes Has patient had a PCN reaction causing severe rash involving mucus membranes or skin necrosis: Yes Has patient had a PCN reaction that required hospitalization No Has patient had a PCN reaction occurring within the last 10 years: No If all of the above answers are "NO", then may proceed with Cephalosporin use.     Review of Systems  Constitutional: Positive for activity change and fatigue. Negative for chills, fever and unexpected weight change.  Eyes: Negative for visual  disturbance.  Respiratory: Positive for shortness of breath. Negative for cough and wheezing.        Orthopnea  Cardiovascular: Positive for chest pain (Atypical). Negative for palpitations and leg swelling.       Severe varicose veins. Previous ablation and stripping on right leg  Gastrointestinal: Positive for abdominal pain (Reflux). Negative for blood in stool.       History colostomy  Genitourinary: Negative for difficulty urinating and dysuria.  Musculoskeletal: Positive for arthralgias and joint swelling (DIP joints in fingers).  Neurological: Positive for numbness. Negative for syncope and weakness.  Hematological: Negative for adenopathy. Bruises/bleeds easily.  Psychiatric/Behavioral: Negative for dysphoric mood. The patient is nervous/anxious.   All other systems reviewed and are negative.   BP 97/67 (BP Location: Right Arm, Patient Position: Sitting, Cuff Size: Small)   Pulse 67   Resp 20   Ht 5' (1.524 m)   Wt 128 lb (  58.1 kg)   SpO2 99% Comment: RA  BMI 25.00 kg/m  Physical Exam  Constitutional: She is oriented to person, place, and time.  Elderly woman in no acute distress  HENT:  Head: Normocephalic and atraumatic.  Mouth/Throat: No oropharyngeal exudate.  Eyes: Conjunctivae and EOM are normal. No scleral icterus.  Neck: Neck supple. No thyromegaly present.  No carotid bruits  Cardiovascular: Normal rate, regular rhythm and intact distal pulses.   Normal Allen's test on the left. Severe varicosities entire right leg and thigh. Moderately severe varicosities left leg.  Pulmonary/Chest: Effort normal and breath sounds normal. No respiratory distress. She has no wheezes. She has no rales.  Abdominal: Soft. She exhibits no distension. There is no tenderness.  Musculoskeletal: She exhibits edema (1+ bilaterally).       Arms: Lymphadenopathy:    She has no cervical adenopathy.  Neurological: She is alert and oriented to person, place, and time. No cranial nerve  deficit.  No focal motor deficit  Skin: Skin is warm and dry.  Vitals reviewed.  Diagnostic Tests: Stress test Study Result    Defect 1: There is a large defect of severe severity present in the basal inferoseptal, basal inferior, basal inferolateral, mid inferoseptal, mid inferior, mid inferolateral, apical inferior and apical lateral location.  This is a high risk study.  Nuclear stress EF: 21%.  Findings consistent with prior myocardial infarction with mild degree of peri-infarct ischemia primarily in the apical inferolateral walls.  ST-T abnormalities present at rest and throughout study.    Echocardiogram 06/06/2016 Study Conclusions  - Left ventricle: The cavity size was moderately dilated. Deckard   thickness was increased in a pattern of moderate LVH. Systolic   function was severely reduced. The estimated ejection fraction   was in the range of 25% to 30%. There is akinesis and scarring of   the inferolateral and inferior myocardium. Doppler parameters are   consistent with abnormal left ventricular relaxation (grade 1   diastolic dysfunction). - Aortic valve: Mildly calcified annulus. Trileaflet; moderately   calcified leaflets. Left coronary cusp mobility was restricted.   There was mild regurgitation. - Mitral valve: Calcified annulus. Mildly calcified leaflets .   There was mild regurgitation. - Left atrium: The atrium was moderately dilated. - Right atrium: Central venous pressure (est): 3 mm Hg. - Tricuspid valve: There was trivial regurgitation. - Pulmonary arteries: Systolic pressure could not be accurately   estimated. - Pericardium, extracardiac: There was no pericardial effusion.  Impressions:  - Moderate LV chamber dilatation with moderate LVH and LVEF 25-30%.   There is akinesis and thinning of the inferior/inferolateral [place]   consistent with ischemic cardiomyopathy. Grade 1 diastolic   dysfunction. Moderate left atrial enlargement. Calcified  mitral   annulus with mild mitral regurgitation. Moderately sclerotic   aortic valve with mild aortic regurgitation. Trivial tricuspid   regurgitation.  Cardiac catheterization 06/12/2016 Conclusion     Ost RCA to Mid RCA lesion, 100 %stenosed. The entire distal RCA system with 2 posterolateral branches and the PDA are filled via collaterals from the LAD septals and diagonal branches  Prox Cx lesion, 100 %stenosed. 2 branches fill via collaterals from diagonal branch  Mid LAD lesion, 70 %stenosed. 1st Diag lesion, 80 %stenosed.  There is severe left ventricular systolic dysfunction as indicated by echocardiogram and Myoview stress test  LV end diastolic pressure is moderately elevated. 25 mmHg  There is no aortic valve stenosis.   Severe multivessel CAD with chronically occluded circumflex and RCA  both filling via collaterals. The LAD itself is a long tubular 70% as well as the diagonal having focal 80%.  Patient's best option is for CT surgical consultation  PLAN: Return to the short stay holding area for TR band removal Discontinue Plavix CT surgery has been consulted. They will try to arrange outpatient consultation either tomorrow or on Monday.   I personally reviewed the echocardiogram and cardiac catheterization images and concur with the findings noted above.  Impression:  Mrs. Petit is an 82 year old woman who presents with atypical angina and acute left-sided systolic congestive heart failure. She's been found to have severe three-vessel coronary disease with severe ischemic cardiomyopathy with ejection fraction from 20-30%. She has totally occluded circumflex and right coronary and a 70% stenosis in her LAD. There also is an 80% stenosis in the first diagonal. LV end diastolic pressure is moderately elevated at 25 mmHg. Her BNP is 1545.  Coronary artery bypass grafting is her best option for treatment. Revascularization percutaneously would be incomplete. We may be  limited to some extent with a complete revascularization due to the possibility of an adequate conduit. She has had a laser ablation and stab phlebectomy of the right greater saphenous vein. It is possible she may have some usable vein in the left thigh and up so that would be helpful. We will do a vein mapping of the left thigh to see whether its worth attempting to harvest. Her Allen's test does appear adequate to tolerate removal of the left radial artery, but we will need to see the vascular lab confirmation of that. We also will likely need to use bilateral mammary arteries. This less than ideal in an 82 year old with osteoporosis, but there may be of no other option.  I discussed the general nature of the procedure, the need for general anesthesia, the use of cardiopulmonary bypass, and incisions to be used with Mrs. Sandell and her family (including a daughter from [place] who was on the telephone). I informed them of the expected hospital stay, overall recovery and short and long term outcomes. They understand the high-risk nature of the procedure given her advanced age and severe left ventricular dysfunction. They understand the risks, include but are not limited to death (5-10%), stroke (5-10%), MI, DVT/PE, bleeding, need for transfusion, infections, cardiac arrhythmias, other organ system dysfunction including respiratory or renal failure, or GI complications.   She accepts the risks and agrees to proceed.  Plan: Vein mapping of left leg Carotid duplex Point function testing Coronary bypass grafting with bilateral IMA, possible left radial, possible left greater saphenous vein on Friday, 06/20/2016.  I spent 1 hour with Mrs. Thorstenson during this visit  Loreli SlotSteven C Terianne Thaker, MD [facility] [phone]

## 2016-06-20 NOTE — Anesthesia Procedure Notes (Signed)
Procedure Name: Intubation Date/Time: 06/20/2016 8:42 AM Performed by: Jed Limerick Pre-anesthesia Checklist: Patient identified, Emergency Drugs available, Suction available and Patient being monitored Patient Re-evaluated:Patient Re-evaluated prior to inductionOxygen Delivery Method: Circle System Utilized Preoxygenation: Pre-oxygenation with 100% oxygen Intubation Type: IV induction Ventilation: Mask ventilation without difficulty Laryngoscope Size: Mac and 3 Grade View: Grade I Tube type: Oral Tube size: 8.0 mm Number of attempts: 1 Airway Equipment and Method: Stylet and Oral airway Placement Confirmation: ETT inserted through vocal cords under direct vision,  positive ETCO2 and breath sounds checked- equal and bilateral Secured at: 22 cm Tube secured with: Tape Dental Injury: Teeth and Oropharynx as per pre-operative assessment

## 2016-06-20 NOTE — Progress Notes (Signed)
  Echocardiogram Echocardiogram Transesophageal has been performed.  Alissa Pharr L Androw 06/20/2016, 9:17 AM

## 2016-06-20 NOTE — Progress Notes (Signed)
      [address]       [place] [address]             [phone]      Intubated, sedated  BP 102/87   Pulse 83   Temp 97 F (36.1 C)   Resp 12   Ht 5' (1.524 m)   Wt 127 lb (57.6 kg)   SpO2 100%   BMI 24.80 kg/m   28/14 CI= 2.1  On dopamine 3 and milrinone 0.3  K= 2.5 being replenished now  Hct= 32  Doing well early postop  Viviann Spare C. Dorris Fetch, MD [facility] [phone]

## 2016-06-20 NOTE — Anesthesia Postprocedure Evaluation (Signed)
Anesthesia Post Note  Patient: Cheryl [name]  Procedure(s) Performed: Procedure(s) (LRB): CORONARY ARTERY BYPASS GRAFTING (CABG)x4 EVH LEFT GREATER SAPHENOUS VEIN  LIMA-LAD SVG-PD SVG-DIAD L RADIAL-OM (N/A) RADIAL ARTERY HARVEST (Left) TRANSESOPHAGEAL ECHOCARDIOGRAM (TEE) (N/A)  Patient location during evaluation: SICU Anesthesia Type: General Level of consciousness: sedated Pain management: pain level controlled Vital Signs Assessment: post-procedure vital signs reviewed and stable Respiratory status: patient remains intubated per anesthesia plan Cardiovascular status: stable Anesthetic complications: no    Last Vitals:  Vitals:   06/20/16 0605 06/20/16 1443  BP: 132/61 (!) 115/58  Pulse: (!) 58 92  Resp: 18 12  Temp: 36.7 C     Last Pain:  Vitals:   06/20/16 8115  TempSrc: Osvaldo Angst                 Kennieth Rad

## 2016-06-20 NOTE — Anesthesia Procedure Notes (Signed)
Central Venous Catheter Insertion Performed by: anesthesiologist Patient location: Pre-op. Preanesthetic checklist: patient identified, IV checked, site marked, risks and benefits discussed, surgical consent, monitors and equipment checked, pre-op evaluation, timeout performed and anesthesia consent Position: Trendelenburg Lidocaine 1% used for infiltration Landmarks identified and Seldinger technique used Catheter size: 9 Fr Central line was placed.MAC introducer Swan type and PA catheter depth:thermodilution and 50PA Cath depth:50 Procedure performed using ultrasound guided technique. Attempts: 1 Following insertion, line sutured and dressing applied. Post procedure assessment: blood return through all ports, free fluid flow and no air. Patient tolerated the procedure well with no immediate complications.

## 2016-06-20 NOTE — Procedures (Signed)
Extubation Procedure Note  Patient Details:   Name: Cheryl [name] DOB: Aug 18, 1932 MRN: [id number]   Airway Documentation:  Airway 8 mm (Active)  Secured at (cm) 22 cm 06/20/2016  4:49 PM  Measured From Lips 06/20/2016  4:49 PM  Secured Location Right 06/20/2016  4:49 PM  Secured By Caron Presume Tape 06/20/2016  4:49 PM  Cuff Pressure (cm H2O) 26 cm H2O 06/20/2016  4:49 PM  Site Condition Dry 06/20/2016  4:49 PM    Evaluation  O2 sats: stable throughout Complications: No apparent complications Patient did tolerate procedure well. Bilateral Breath Sounds: Clear, Diminished   Yes   Patient meets criteria for extubation per rapid wean protocol.  ABG within protocol limits.  Leak test positive.  ETT/Oral suctioning completed prior to extubation.  Patient extubated to 4l [place].  Able to cough and speak.  RN to instruct IS.  Ronda Fairly Mayli Covington 06/20/2016, 7:28 PM

## 2016-06-20 NOTE — Progress Notes (Signed)
Patient tolerating PSV wean well.  Able to lift head from pillow and follow commands.  ABG within protocol limits.  VC: 643 ml NIF: -22

## 2016-06-20 NOTE — Interval H&P Note (Signed)
History and Physical Interval Note:  Awaiting repeat BMET to recheck on hyponatremia Plan CABG with LIMA, left radial, and left GSV if usable, if not will take RIMA  06/20/2016 7:07 AM  [name]  has presented today for surgery, with the diagnosis of CAD  The various methods of treatment have been discussed with the patient and family. After consideration of risks, benefits and other options for treatment, the patient has consented to  Procedure(s): CORONARY ARTERY BYPASS GRAFTING (CABG) WITH BILATERAL IMA (N/A) POSSIBLE RADIAL ARTERY HARVEST (Left) TRANSESOPHAGEAL ECHOCARDIOGRAM (TEE) (N/A) as a surgical intervention .  The patient's history has been reviewed, patient examined, no change in status, stable for surgery.  I have reviewed the patient's chart and labs.  Questions were answered to the patient's satisfaction.     Cheryl [name]

## 2016-06-20 NOTE — Brief Op Note (Addendum)
06/20/2016  1:34 PM  PATIENT:  Cheryl [name]  82 y.o. female  PRE-OPERATIVE DIAGNOSIS:  CAD  POST-OPERATIVE DIAGNOSIS:  Coronary artery disease  PROCEDURE:  Procedure(s): CORONARY ARTERY BYPASS GRAFTING (CABG) (N/A) RADIAL ARTERY HARVEST (Left) TRANSESOPHAGEAL ECHOCARDIOGRAM (TEE) (N/A)  EVH LEFT GREATER SAPHENOUS VEIN  LIMA-LAD SVG-PD SVG-DIAD L RADIAL-OM   SURGEON:  Surgeon(s) and Role:    * Loreli Slot, MD - Primary  PHYSICIAN ASSISTANT: WAYNE GOLD PA-C   ANESTHESIA:   general  EBL:  Total I/O In: 1300 [I.V.:1300] Out: 840 [Urine:840]  BLOOD ADMINISTERED:350 CC PRBC  DRAINS: ROUTINE   LOCAL MEDICATIONS USED:  NONE  SPECIMEN:  No Specimen  DISPOSITION OF SPECIMEN:  N/A  COUNTS:  YES  PLAN OF CARE: Admit to inpatient   PATIENT DISPOSITION:  ICU - intubated and hemodynamically stable.   Delay start of Pharmacological VTE agent (>24hrs) due to surgical blood loss or risk of bleeding: yes  XC= 71 min CPB= 104 min Good targets. LIMA good, radial and vein fair Off pump on milrinone 0.3 and dopamine at 3

## 2016-06-21 ENCOUNTER — Inpatient Hospital Stay (HOSPITAL_COMMUNITY): Payer: Medicare Other

## 2016-06-21 LAB — POCT I-STAT, CHEM 8
BUN: 6 mg/dL (ref 6–20)
BUN: 5 mg/dL — AB (ref 6–20)
CHLORIDE: 97 mmol/L — AB (ref 101–111)
CREATININE: 0.7 mg/dL (ref 0.44–1.00)
Calcium, Ion: 1.02 mmol/L — ABNORMAL LOW (ref 1.15–1.40)
Calcium, Ion: 1.09 mmol/L — ABNORMAL LOW (ref 1.15–1.40)
Chloride: 96 mmol/L — ABNORMAL LOW (ref 101–111)
Creatinine, Ser: 0.5 mg/dL (ref 0.44–1.00)
Glucose, Bld: 111 mg/dL — ABNORMAL HIGH (ref 65–99)
Glucose, Bld: 129 mg/dL — ABNORMAL HIGH (ref 65–99)
HEMATOCRIT: 27 % — AB (ref 36.0–46.0)
HEMATOCRIT: 35 % — AB (ref 36.0–46.0)
Hemoglobin: 9.2 g/dL — ABNORMAL LOW (ref 12.0–15.0)
Hemoglobin: 11.9 g/dL — ABNORMAL LOW (ref 12.0–15.0)
POTASSIUM: 3.4 mmol/L — AB (ref 3.5–5.1)
Potassium: 3.3 mmol/L — ABNORMAL LOW (ref 3.5–5.1)
SODIUM: 135 mmol/L (ref 135–145)
Sodium: 130 mmol/L — ABNORMAL LOW (ref 135–145)
TCO2: 26 mmol/L (ref 0–100)
TCO2: 26 mmol/L (ref 0–100)

## 2016-06-21 LAB — CBC
HCT: 27.4 % — ABNORMAL LOW (ref 36.0–46.0)
HCT: 25 % — ABNORMAL LOW (ref 36.0–46.0)
HEMOGLOBIN: 9.4 g/dL — AB (ref 12.0–15.0)
Hemoglobin: 8.4 g/dL — ABNORMAL LOW (ref 12.0–15.0)
MCH: 29.7 pg (ref 26.0–34.0)
MCH: 29.3 pg (ref 26.0–34.0)
MCHC: 34.3 g/dL (ref 30.0–36.0)
MCHC: 33.6 g/dL (ref 30.0–36.0)
MCV: 86.4 fL (ref 78.0–100.0)
MCV: 87.1 fL (ref 78.0–100.0)
PLATELETS: 111 10*3/uL — AB (ref 150–400)
PLATELETS: 105 10*3/uL — AB (ref 150–400)
RBC: 3.17 MIL/uL — AB (ref 3.87–5.11)
RBC: 2.87 MIL/uL — ABNORMAL LOW (ref 3.87–5.11)
RDW: 13.4 % (ref 11.5–15.5)
RDW: 13.7 % (ref 11.5–15.5)
WBC: 8.3 10*3/uL (ref 4.0–10.5)
WBC: 8.2 10*3/uL (ref 4.0–10.5)

## 2016-06-21 LAB — BASIC METABOLIC PANEL
ANION GAP: 8 (ref 5–15)
BUN: 5 mg/dL — ABNORMAL LOW (ref 6–20)
CO2: 25 mmol/L (ref 22–32)
Calcium: 7.3 mg/dL — ABNORMAL LOW (ref 8.9–10.3)
Chloride: 101 mmol/L (ref 101–111)
Creatinine, Ser: 0.61 mg/dL (ref 0.44–1.00)
GFR calc Af Amer: >60 mL/min (ref >60)
GLUCOSE: 120 mg/dL — AB (ref 65–99)
POTASSIUM: 3.7 mmol/L (ref 3.5–5.1)
Sodium: 134 mmol/L — ABNORMAL LOW (ref 135–145)

## 2016-06-21 LAB — GLUCOSE, CAPILLARY
GLUCOSE-CAPILLARY: 92 mg/dL (ref 65–99)
GLUCOSE-CAPILLARY: 105 mg/dL — AB (ref 65–99)
GLUCOSE-CAPILLARY: 116 mg/dL — AB (ref 65–99)
GLUCOSE-CAPILLARY: 121 mg/dL — AB (ref 65–99)
GLUCOSE-CAPILLARY: 77 mg/dL (ref 65–99)
Glucose-Capillary: 100 mg/dL — ABNORMAL HIGH (ref 65–99)
Glucose-Capillary: 117 mg/dL — ABNORMAL HIGH (ref 65–99)
Glucose-Capillary: 146 mg/dL — ABNORMAL HIGH (ref 65–99)
Glucose-Capillary: 82 mg/dL (ref 65–99)

## 2016-06-21 LAB — CREATININE, SERUM
Creatinine, Ser: 0.72 mg/dL (ref 0.44–1.00)
GFR calc Af Amer: >60 mL/min (ref >60)
GFR calc non Af Amer: >60 mL/min (ref >60)

## 2016-06-21 LAB — MAGNESIUM
MAGNESIUM: 2.2 mg/dL (ref 1.7–2.4)
Magnesium: 2 mg/dL (ref 1.7–2.4)

## 2016-06-21 MED ORDER — ASPIRIN EC 81 MG PO TBEC
81.0000 mg | DELAYED_RELEASE_TABLET | Freq: Every day | ORAL | Status: DC
  Administered 2016-06-22 – 2016-06-30 (×9): 81 mg via ORAL
  Filled 2016-06-21 (×9): qty 1

## 2016-06-21 MED ORDER — INSULIN ASPART 100 UNIT/ML ~~LOC~~ SOLN: 0.0000 [IU] | SUBCUTANEOUS | Status: DC

## 2016-06-21 MED ORDER — LEVOFLOXACIN 500 MG PO TABS
500.0000 mg | ORAL_TABLET | Freq: Every day | ORAL | Status: DC
  Administered 2016-06-21: 500 mg via ORAL
  Filled 2016-06-21: qty 1

## 2016-06-21 MED ORDER — POTASSIUM CHLORIDE 10 MEQ/50ML IV SOLN
INTRAVENOUS | Status: AC
  Administered 2016-06-21: 10 meq
  Filled 2016-06-21: qty 50

## 2016-06-21 MED ORDER — ISOSORBIDE MONONITRATE ER 30 MG PO TB24
15.0000 mg | ORAL_TABLET | Freq: Every day | ORAL | Status: DC
  Administered 2016-06-21 – 2016-06-30 (×10): 15 mg via ORAL
  Filled 2016-06-21 (×10): qty 1

## 2016-06-21 MED ORDER — POTASSIUM CHLORIDE 10 MEQ/50ML IV SOLN
10.0000 meq | INTRAVENOUS | Status: AC
  Administered 2016-06-21 (×3): 10 meq via INTRAVENOUS
  Filled 2016-06-21: qty 50

## 2016-06-21 MED ORDER — POTASSIUM CHLORIDE 10 MEQ/50ML IV SOLN
10.0000 meq | Freq: Once | INTRAVENOUS | Status: AC
  Administered 2016-06-21: 10 meq via INTRAVENOUS
  Filled 2016-06-21: qty 50

## 2016-06-21 MED ORDER — METOCLOPRAMIDE HCL 5 MG/ML IJ SOLN
5.0000 mg | Freq: Four times a day (QID) | INTRAMUSCULAR | Status: AC
  Administered 2016-06-21 – 2016-06-22 (×4): 5 mg via INTRAVENOUS
  Filled 2016-06-21 (×3): qty 2

## 2016-06-21 MED ORDER — LEVOFLOXACIN 500 MG PO TABS: 500.0000 mg | ORAL_TABLET | ORAL | Status: DC

## 2016-06-21 MED ORDER — ALBUMIN HUMAN 5 % IV SOLN
12.5000 g | Freq: Once | INTRAVENOUS | Status: AC
  Administered 2016-06-21: 12.5 g via INTRAVENOUS

## 2016-06-21 MED ORDER — INSULIN ASPART 100 UNIT/ML ~~LOC~~ SOLN: 3.0000 [IU] | Freq: Three times a day (TID) | SUBCUTANEOUS | Status: DC

## 2016-06-21 MED ORDER — INSULIN ASPART 100 UNIT/ML ~~LOC~~ SOLN
0.0000 [IU] | SUBCUTANEOUS | Status: DC
  Administered 2016-06-21: 2 [IU] via SUBCUTANEOUS

## 2016-06-21 MED ORDER — ENOXAPARIN SODIUM 30 MG/0.3ML ~~LOC~~ SOLN
30.0000 mg | Freq: Every day | SUBCUTANEOUS | Status: DC
  Administered 2016-06-21 – 2016-06-23 (×3): 30 mg via SUBCUTANEOUS
  Filled 2016-06-21 (×3): qty 0.3

## 2016-06-21 MED ORDER — ORAL CARE MOUTH RINSE
15.0000 mL | Freq: Two times a day (BID) | OROMUCOSAL | Status: DC
  Administered 2016-06-21 – 2016-06-30 (×12): 15 mL via OROMUCOSAL

## 2016-06-21 MED ORDER — FUROSEMIDE 10 MG/ML IJ SOLN
20.0000 mg | Freq: Once | INTRAMUSCULAR | Status: AC
  Administered 2016-06-21: 20 mg via INTRAVENOUS
  Filled 2016-06-21: qty 2

## 2016-06-21 NOTE — Progress Notes (Signed)
      [address]       [place] [address]             [phone]      No pain at present. Has had some nausea but no emesis  BP (!) 119/58 (BP Location: Right Arm)   Pulse 80   Temp 98.1 F (36.7 C) (Oral)   Resp 14   Ht 5' (1.524 m)   Wt 135 lb 5.8 oz (61.4 kg)   SpO2 100%   BMI 26.44 kg/m    Intake/Output Summary (Last 24 hours) at 06/21/16 1804 Last data filed at 06/21/16 1700  Gross per 24 hour  Intake          2602.99 ml  Output             2030 ml  Net           572.99 ml    K= 3.4- supplement  Ambulated 300' today  Shelden Raborn C. Dorris Fetch, MD [facility] [phone]

## 2016-06-21 NOTE — Progress Notes (Signed)
1 Day Post-Op Procedure(s) (LRB): CORONARY ARTERY BYPASS GRAFTING (CABG)x4 EVH LEFT GREATER SAPHENOUS VEIN  LIMA-LAD SVG-PD SVG-DIAD L RADIAL-OM (N/A) RADIAL ARTERY HARVEST (Left) TRANSESOPHAGEAL ECHOCARDIOGRAM (TEE) (N/A) Subjective: C/o irritation in throat No significant incisional pain, denies nausea  Objective: Vital signs in last 24 hours: Temp:  [97 F (36.1 C)-99.5 F (37.5 C)] 99.3 F (37.4 C) (11/11 0800) Pulse Rate:  [62-95] 87 (11/11 0800) Cardiac Rhythm: Atrial paced (11/11 0800) Resp:  [0-25] 19 (11/11 0800) BP: (87-137)/(51-87) 132/71 (11/11 0800) SpO2:  [99 %-100 %] 100 % (11/11 0800) Arterial Line BP: (106-344)/(50-105) 181/92 (11/11 0800) FiO2 (%):  [40 %-50 %] 40 % (11/11 0700) Weight:  [135 lb 5.8 oz (61.4 kg)] 135 lb 5.8 oz (61.4 kg) (11/11 0500)  Hemodynamic parameters for last 24 hours: PAP: (18-38)/(7-21) 27/9 CO:  [2.7 L/min-4.1 L/min] 3.9 L/min CI:  [1.7 L/min/m2-2.7 L/min/m2] 2.5 L/min/m2  Intake/Output from previous day: 11/10 0701 - 11/11 0700 In: 5416.2 [I.V.:3486.2; Blood:300; NG/GT:30; IV Piggyback:1600] Out: 4795 [Urine:3850; Emesis/NG output:95; Blood:600; Chest Tube:250] Intake/Output this shift: Total I/O In: 94.4 [I.V.:94.4] Out: 135 [Urine:125; Chest Tube:10]  General appearance: alert, cooperative and no distress Neurologic: intact Heart: regular rate and rhythm Lungs: diminished breath sounds bibasilar and left > right Abdomen: normal findings: soft, non-tender  Lab Results:  Recent Labs  06/20/16 2037  06/21/16 0232 06/21/16 0500  WBC 9.6  --   --  8.3  HGB 10.1*  < > 9.2* 9.4*  HCT 29.6*  < > 27.0* 27.4*  PLT 120*  --   --  111*  < > = values in this interval not displayed. BMET:  Recent Labs  06/20/16 0613  06/21/16 0232 06/21/16 0500  NA 126*  < > 135 134*  K 2.6*  < > 3.3* 3.7  CL 87*  < > 97* 101  CO2 28  --   --  25  GLUCOSE 97  < > 111* 120*  BUN 11  < > 6 5*  CREATININE 0.93  < > 0.50 0.61  CALCIUM  9.5  --   --  7.3*  < > = values in this interval not displayed.  PT/INR:  Recent Labs  06/20/16 1455  LABPROT 18.4*  INR 1.52   ABG    Component Value Date/Time   PHART 7.371 06/20/2016 2043   HCO3 29.4 (H) 06/20/2016 2043   TCO2 26 06/21/2016 0232   O2SAT 99.0 06/20/2016 2043   CBG (last 3)   Recent Labs  06/21/16 0111 06/21/16 0408 06/21/16 0842  GLUCAP 92 105* 100*    Assessment/Plan: S/P Procedure(s) (LRB): CORONARY ARTERY BYPASS GRAFTING (CABG)x4 EVH LEFT GREATER SAPHENOUS VEIN  LIMA-LAD SVG-PD SVG-DIAD L RADIAL-OM (N/A) RADIAL ARTERY HARVEST (Left) TRANSESOPHAGEAL ECHOCARDIOGRAM (TEE) (N/A) POD # 1  CV- s/p CABG for CAD with ischemic cardiomyopathy. In SR, good index on dopamine and milrinone  ASA, statin, betablocker  Wean dopamine, continue milrinone  Imdur for radial graft  Dc swan and A line  RESP- IS for left lower lobe atelectasis  RENAL- creatinine Ok  ENDO- CBG well controlled  Gi advance diet as tolerated  Anemia sec to ABL- follow  SCD + enoxaparin for DVT prophylaxis  DC CT  Mobilize   LOS: 1 day    Loreli Slot 06/21/2016

## 2016-06-21 NOTE — Op Note (Signed)
NAMEHAROLD, MATTES                ACCOUNT NO.:  [id number]  MEDICAL RECORD NO.:  [id number]  LOCATION:  2S13C                        FACILITY:  [facility]  PHYSICIAN:  Salvatore Decent. Dorris Fetch, M.D.DATE OF BIRTH:  10-Apr-1933  DATE OF PROCEDURE:  06/20/2016 DATE OF DISCHARGE:                              OPERATIVE REPORT   PREOPERATIVE DIAGNOSIS:  Severe three-vessel coronary disease with atypical angina and congestive heart failure with severe ischemic cardiomyopathy.  POSTOPERATIVE DIAGNOSIS:  Severe three-vessel coronary disease with atypical angina and congestive heart failure with severe ischemic cardiomyopathy.  PROCEDURE:   Median sternotomy, extracorporeal circulation, Coronary artery bypass grafting x 4  Left internal mammary artery to LAD,  Left radial artery to OM 1,  Saphenous vein graft to first diagonal,  Saphenous vein graft to posterior descending Endoscopic vein harvest left thigh.  SURGEON:  Salvatore Decent. Dorris Fetch, M.D.  FIRST ASSISTANT:  Rowe Clack, P.A.-C.  ANESTHESIA:  General.  FINDINGS:  Transesophageal echocardiography showed global hypokinesis with ejection fraction of 25%-30%.  There was mild aortic insufficiency and mild mitral insufficiency.  Good quality coronary targets.  LIMA good quality.  Radial and saphenous vein fair quality.  CLINICAL NOTE:  Mrs. Nienhaus is an 82 year old woman, who presented with atypical angina and acute left-sided congestive heart failure.  She was found to have severe three-vessel disease with severe ischemic cardiomyopathy with an ejection fraction of 20%-30%.  The circumflex and right coronary were totally occluded.  There was significant disease in the LAD and diagonal as well.  She was referred for coronary artery bypass grafting.  She was noted to have likelihood of limited vein due to previous vein stripping and varicosities in the right leg.  Vein mapping of the left leg showed what appeared to be adequate conduit  in the thigh. She had a normal Allen's test on the left.  It was felt that there would be sufficient conduit to perform bypass grafting.  The indications, risks, benefits, and alternatives were discussed in detail with the patient and her family.  She accepted the risks and agreed to proceed.  OPERATIVE NOTE:  Mrs. Macek was brought to the preoperative holding area on June 20, 2016.  Anesthesia placed a Swan-Ganz catheter and an arterial blood pressure monitoring line.  The normal Allen's test on the left side was confirmed with pulse oximetry.  She was taken to the operating room, anesthetized, and intubated.  Transesophageal echocardiography was performed by Dr. Marcene Duos.  A Foley catheter was placed.  The chest, abdomen, legs, and left arm were prepped and draped in usual sterile fashion.    A median sternotomy was performed.  There was sternal osteoporosis.  The left internal mammary artery was harvested using standard technique.  It was a good quality vessel with good flow.  2000 units of heparin was administered during the vessel harvest. Simultaneously, an incision was made in the medial aspect of the left leg at the level of the knee.  The greater saphenous vein was harvested from the left thigh to just below the knee.  Around the knee, there were significant varicosities. The upper thigh portion, which was ultimately used for the posterior descending graft, was  of better quality.  The vein was felt to be adequate quality for 2 grafts, but would not be sufficient to reach the obtuse marginal.  Therefore, the left radial artery was harvested using standard technique.  A good pulse distally with proximal occlusion was confirmed prior to dividing the radial artery.  The radial artery was harvested using the Harmonic Scalpel.  After harvesting the conduits, remainder of full heparin dose was given. The left arm was wrapped and tucked back to the patient's side.   A sternal retractor was placed.  The pericardium was opened.  The ascending aorta was inspected.  There was no significant atherosclerotic disease.  After confirming adequate anticoagulation with ACT measurement, the aorta was cannulated via concentric 2-0 Ethibond pledgeted pursestring sutures.  A dual stage venous cannula was placed via a pursestring suture in the right atrial appendage.  Cardiopulmonary bypass was initiated.  Flows were maintained per protocol.  The patient was cooled to 32 degrees Celsius.  The coronary arteries were inspected and anastomotic sites were chosen.  The conduits were inspected and cut to length.  A foam pad was placed in the pericardium to insulate the heart.  A temperature probe was placed in the myocardial septum, and a cardioplegia cannula was placed in the ascending aorta.  The aorta was crossclamped.  The left ventricle was emptied via the aortic root vent.  Cardiac arrest then was achieved with combination of cold antegrade blood cardioplegia and topical iced saline.  1 L of cardioplegia was administered.  There was a rapid diastolic arrest and septal cooling to 11 degrees Celsius.  A reversed saphenous vein graft was placed end-to-side to the posterior descending branch of the right coronary, this was a 1.5 mm vessel. There was a size mismatch as the vein was relatively large, but this portion of the vein had no varicosities and was of better quality.  The end-to-side anastomosis was performed with a running 7-0 Prolene suture. All anastomoses were probed proximally and distally at their completion prior to tying the suture.  Cardioplegia was administered down the graft and there was good flow and good hemostasis.  Next, a reversed saphenous vein graft was placed end-to-side to the first diagonal branch of the LAD.  This was a 1.5 mm good quality target.  The vein was fair quality.  There were diffuse varicosities of this vein. There was a size  mismatch with the coronary.  The end-to-side anastomosis was performed with a running 7-0 Prolene suture.  At the completion of the anastomosis, cardioplegia was administered, and there was good flow and good hemostasis.  Next, the distal end of the left radial artery was beveled.  It was anastomosed end-to-side to the obtuse marginal branch of the totally occluded circumflex.  This was the only vessel that appeared graftable along the lateral Ruta.  An end-to-side anastomosis was performed with a running 8-0 Prolene suture.  A probe passed easily proximally and distally with cardioplegia administration.  There was good backbleeding from the radial artery.  Additional cardioplegia was administered down the aortic root.  The left internal mammary artery then was brought through a window in the pericardium.  The distal end was beveled, it was anastomosed end-to-side to the distal LAD.  The LAD was a 2 mm good quality target.  The mammary was a 1.5 mm good quality conduit.  The end-to-side anastomosis was performed with a running 8-0 Prolene suture.  At the completion of the mammary to LAD anastomosis, the bulldog clamp  was briefly removed to inspect for hemostasis, rapid septal rewarming was noted, the bulldog clamp was replaced.  Additional cardioplegia was administered.  The vein grafts were cut to length.  The proximal vein graft anastomoses were performed to 4.5 mm punch aortotomies with running 6-0 Prolene sutures.  At the completion of the 2nd vein graft proximal, the patient was placed in Trendelenburg position.  Lidocaine was administered.  The aortic root was de-aired. The bulldog clamp was removed from the left mammary artery and the aortic crossclamp was removed.  Total crossclamp time was 71 minutes. The patient initially fibrillated, but spontaneously defibrillated without need for cardioversion.  Bulldog clamps were placed proximally and distally on the vein graft to the  diagonal.  A venotomy was made.  The proximal anastomosis for the radial graft was placed to the vein as it was too small to graft directly to the aorta.  This end-to-side anastomosis was performed with a running 7-0 Prolene suture.  All proximal and distal anastomoses were inspected for hemostasis.  The patient was loaded with milrinone and a drip was started at 0.3 mcg/kg/minute.  A dopamine infusion was also initiated at 3 mcg/kg/minute.  The patient was in sinus rhythm.  When the core temperature reached 37 degrees Celsius, she was weaned from cardiopulmonary bypass on the first attempt.  Total bypass time was 104 minutes.  The initial cardiac index was greater than 2 L/min/squared m.  A test dose of protamine was administered and was well tolerated.  The atrial and aortic cannulae were removed.  Post bypass transesophageal echocardiography was essentially unchanged from the prebypass study. The chest was copiously irrigated with warm saline.  Hemostasis was achieved.  The pericardium was not closed.  Left pleural and mediastinal chest tubes were placed through separate subcostal incisions.  The sternum was closed with a combination of single and double heavy gauge stainless steel wires.  The pectoralis fascia, subcutaneous tissue, and skin were closed in standard fashion.  All sponge, needle, and instrument counts were correct at the end of the procedure.  The patient was taken from the operating room to the Surgical Intensive Care Unit intubated and in good condition.     Salvatore Decent Dorris Fetch, M.D.     SCH/MEDQ  D:  06/20/2016  T:  06/21/2016  Job:  [id number]

## 2016-06-22 ENCOUNTER — Inpatient Hospital Stay (HOSPITAL_COMMUNITY): Payer: Medicare Other

## 2016-06-22 LAB — BASIC METABOLIC PANEL
ANION GAP: 8 (ref 5–15)
BUN: 8 mg/dL (ref 6–20)
CALCIUM: 7.7 mg/dL — AB (ref 8.9–10.3)
CO2: 24 mmol/L (ref 22–32)
Chloride: 97 mmol/L — ABNORMAL LOW (ref 101–111)
Creatinine, Ser: 0.81 mg/dL (ref 0.44–1.00)
GFR calc Af Amer: >60 mL/min (ref >60)
GFR calc non Af Amer: >60 mL/min (ref >60)
GLUCOSE: 119 mg/dL — AB (ref 65–99)
Potassium: 3.8 mmol/L (ref 3.5–5.1)
Sodium: 129 mmol/L — ABNORMAL LOW (ref 135–145)

## 2016-06-22 LAB — GLUCOSE, CAPILLARY
GLUCOSE-CAPILLARY: 114 mg/dL — AB (ref 65–99)
GLUCOSE-CAPILLARY: 112 mg/dL — AB (ref 65–99)
GLUCOSE-CAPILLARY: 141 mg/dL — AB (ref 65–99)
GLUCOSE-CAPILLARY: 126 mg/dL — AB (ref 65–99)
Glucose-Capillary: 134 mg/dL — ABNORMAL HIGH (ref 65–99)

## 2016-06-22 LAB — CBC
HCT: 24.4 % — ABNORMAL LOW (ref 36.0–46.0)
HEMOGLOBIN: 8.2 g/dL — AB (ref 12.0–15.0)
MCH: 29.6 pg (ref 26.0–34.0)
MCHC: 33.6 g/dL (ref 30.0–36.0)
MCV: 88.1 fL (ref 78.0–100.0)
Platelets: 103 10*3/uL — ABNORMAL LOW (ref 150–400)
RBC: 2.77 MIL/uL — ABNORMAL LOW (ref 3.87–5.11)
RDW: 14.1 % (ref 11.5–15.5)
WBC: 7.9 10*3/uL (ref 4.0–10.5)

## 2016-06-22 LAB — COOXEMETRY PANEL
Carboxyhemoglobin: 1.6 % — ABNORMAL HIGH (ref 0.5–1.5)
Methemoglobin: 1 % (ref 0.0–1.5)
O2 Saturation: 74.6 %
TOTAL HEMOGLOBIN: 8.1 g/dL — AB (ref 12.0–16.0)

## 2016-06-22 MED ORDER — AMIODARONE HCL IN DEXTROSE 360-4.14 MG/200ML-% IV SOLN
30.0000 mg/h | INTRAVENOUS | Status: DC
  Administered 2016-06-23: 30 mg/h via INTRAVENOUS
  Filled 2016-06-22 (×2): qty 200

## 2016-06-22 MED ORDER — AMIODARONE HCL IN DEXTROSE 360-4.14 MG/200ML-% IV SOLN
60.0000 mg/h | INTRAVENOUS | Status: AC
  Administered 2016-06-22: 60 mg/h via INTRAVENOUS
  Filled 2016-06-22: qty 200

## 2016-06-22 MED ORDER — AMIODARONE LOAD VIA INFUSION
150.0000 mg | Freq: Once | INTRAVENOUS | Status: AC
  Administered 2016-06-22: 150 mg via INTRAVENOUS
  Filled 2016-06-22: qty 83.34

## 2016-06-22 MED ORDER — FUROSEMIDE 40 MG PO TABS
40.0000 mg | ORAL_TABLET | Freq: Every day | ORAL | Status: DC
  Administered 2016-06-22: 40 mg via ORAL
  Filled 2016-06-22: qty 1

## 2016-06-22 MED ORDER — POTASSIUM CHLORIDE 10 MEQ/50ML IV SOLN
10.0000 meq | INTRAVENOUS | Status: AC
  Administered 2016-06-22 (×4): 10 meq via INTRAVENOUS
  Filled 2016-06-22 (×3): qty 50

## 2016-06-22 MED ORDER — INSULIN ASPART 100 UNIT/ML ~~LOC~~ SOLN
0.0000 [IU] | Freq: Three times a day (TID) | SUBCUTANEOUS | Status: DC
  Administered 2016-06-22 – 2016-06-23 (×3): 2 [IU] via SUBCUTANEOUS

## 2016-06-22 NOTE — Progress Notes (Signed)
      [address]       [place] [address]             [phone]      Feels better this afternoon  BP (!) 156/105 (BP Location: Right Arm)   Pulse 63   Temp 97.4 F (36.3 C) (Oral)   Resp 16   Ht 5' (1.524 m)   Wt 137 lb 3.2 oz (62.2 kg)   SpO2 100%   BMI 26.80 kg/m    Intake/Output Summary (Last 24 hours) at 06/22/16 1805 Last data filed at 06/22/16 1600  Gross per 24 hour  Intake           1283.2 ml  Output             2030 ml  Net           -746.8 ml   Back in SR on amiodarone  Azriella Mattia C. Dorris Fetch, MD [facility] [phone]

## 2016-06-22 NOTE — Progress Notes (Signed)
2 Days Post-Op Procedure(s) (LRB): CORONARY ARTERY BYPASS GRAFTING (CABG)x4 EVH LEFT GREATER SAPHENOUS VEIN  LIMA-LAD SVG-PD SVG-DIAD L RADIAL-OM (N/A) RADIAL ARTERY HARVEST (Left) TRANSESOPHAGEAL ECHOCARDIOGRAM (TEE) (N/A) Subjective: Just back from walk and went into atrial fib with RVR  Objective: Vital signs in last 24 hours: Temp:  [97.7 F (36.5 C)-99.5 F (37.5 C)] 97.9 F (36.6 C) (11/12 0822) Pulse Rate:  [66-92] 82 (11/12 0800) Cardiac Rhythm: Normal sinus rhythm (11/12 0800) Resp:  [13-27] 16 (11/12 0800) BP: (85-135)/(48-70) 135/62 (11/12 0800) SpO2:  [99 %-100 %] 100 % (11/12 0800) Arterial Line BP: (103-145)/(40-65) 132/53 (11/12 0100) Weight:  [137 lb 3.2 oz (62.2 kg)] 137 lb 3.2 oz (62.2 kg) (11/12 0600)  Hemodynamic parameters for last 24 hours: PAP: (24-26)/(9-11) 25/9  Intake/Output from previous day: 11/11 0701 - 11/12 0700 In: 1441.6 [I.V.:1391.6; IV Piggyback:50] Out: 2260 [Urine:2230; Chest Tube:30] Intake/Output this shift: Total I/O In: 85.2 [I.V.:85.2] Out: 50 [Urine:50]  General appearance: alert, cooperative and no distress Neurologic: intact Heart: irregularly irregular rhythm Lungs: diminished breath sounds bibasilar L > R Wound: dressing intact  Lab Results:  Recent Labs  06/21/16 1700 06/21/16 1709 06/22/16 0345  WBC 8.2  --  7.9  HGB 8.4* 11.9* 8.2*  HCT 25.0* 35.0* 24.4*  PLT 105*  --  103*   BMET:  Recent Labs  06/21/16 0500  06/21/16 1709 06/22/16 0345  NA 134*  --  130* 129*  K 3.7  --  3.4* 3.8  CL 101  --  96* 97*  CO2 25  --   --  24  GLUCOSE 120*  --  129* 119*  BUN 5*  --  5* 8  CREATININE 0.61  < > 0.70 0.81  CALCIUM 7.3*  --   --  7.7*  < > = values in this interval not displayed.  PT/INR:  Recent Labs  06/20/16 1455  LABPROT 18.4*  INR 1.52   ABG    Component Value Date/Time   PHART 7.371 06/20/2016 2043   HCO3 29.4 (H) 06/20/2016 2043   TCO2 26 06/21/2016 1709   O2SAT 74.6 06/22/2016 0350    CBG (last 3)   Recent Labs  06/21/16 2323 06/22/16 0342 06/22/16 0818  GLUCAP 77 114* 112*    Assessment/Plan: S/P Procedure(s) (LRB): CORONARY ARTERY BYPASS GRAFTING (CABG)x4 EVH LEFT GREATER SAPHENOUS VEIN  LIMA-LAD SVG-PD SVG-DIAD L RADIAL-OM (N/A) RADIAL ARTERY HARVEST (Left) TRANSESOPHAGEAL ECHOCARDIOGRAM (TEE) (N/A) -  CV- s/p CABG.   Ischemic cardiomyopathy- co-ox 74 on milrinone @ 0.3- will wean off over the course fo the day  Rapid atrial fib- start amiodarone  RESP- LLL atelectasis on CXR- add flutter valve  RENAL- creatinine normal,   Hyponatremia- still above preop baseline  Supplement K  Day 3 levaquin for preop UTI- will dc due to possible interaction with amiodarone  ENDO- CBG OK change to AC/HS  Continue cardiac rehab   LOS: 2 days    Cheryl [name] 06/22/2016

## 2016-06-22 NOTE — Progress Notes (Signed)
Current medications have been reviewed for interaction with amiodarone and no interactions noted. No changes needed to current medications.

## 2016-06-23 ENCOUNTER — Inpatient Hospital Stay (HOSPITAL_COMMUNITY): Payer: Medicare Other

## 2016-06-23 ENCOUNTER — Encounter (HOSPITAL_COMMUNITY): Payer: Self-pay | Admitting: Thoracic Surgery (Cardiothoracic Vascular Surgery)

## 2016-06-23 LAB — BASIC METABOLIC PANEL
ANION GAP: 8 (ref 5–15)
BUN: 12 mg/dL (ref 6–20)
CHLORIDE: 94 mmol/L — AB (ref 101–111)
CO2: 24 mmol/L (ref 22–32)
Calcium: 8.8 mg/dL — ABNORMAL LOW (ref 8.9–10.3)
Creatinine, Ser: 0.84 mg/dL (ref 0.44–1.00)
GFR calc non Af Amer: >60 mL/min (ref >60)
Glucose, Bld: 138 mg/dL — ABNORMAL HIGH (ref 65–99)
POTASSIUM: 4.9 mmol/L (ref 3.5–5.1)
SODIUM: 126 mmol/L — AB (ref 135–145)

## 2016-06-23 LAB — TYPE AND SCREEN
ABO/RH(D): O POS
Antibody Screen: NEGATIVE
UNIT DIVISION: 0
Unit division: 0
Unit division: 0
Unit division: 0

## 2016-06-23 LAB — GLUCOSE, CAPILLARY
GLUCOSE-CAPILLARY: 147 mg/dL — AB (ref 65–99)
GLUCOSE-CAPILLARY: 98 mg/dL (ref 65–99)
GLUCOSE-CAPILLARY: 119 mg/dL — AB (ref 65–99)
Glucose-Capillary: 130 mg/dL — ABNORMAL HIGH (ref 65–99)

## 2016-06-23 LAB — CBC
HEMATOCRIT: 29.5 % — AB (ref 36.0–46.0)
HEMOGLOBIN: 9.8 g/dL — AB (ref 12.0–15.0)
MCH: 29.7 pg (ref 26.0–34.0)
MCHC: 33.2 g/dL (ref 30.0–36.0)
MCV: 89.4 fL (ref 78.0–100.0)
Platelets: 159 10*3/uL (ref 150–400)
RBC: 3.3 MIL/uL — AB (ref 3.87–5.11)
RDW: 14 % (ref 11.5–15.5)
WBC: 13 10*3/uL — AB (ref 4.0–10.5)

## 2016-06-23 LAB — OSMOLALITY, URINE: OSMOLALITY UR: 475 mosm/kg (ref 300–900)

## 2016-06-23 LAB — SODIUM, URINE, RANDOM

## 2016-06-23 MED ORDER — SODIUM CHLORIDE 0.9% FLUSH
3.0000 mL | Freq: Two times a day (BID) | INTRAVENOUS | Status: DC
  Administered 2016-06-23: 10 mL via INTRAVENOUS
  Administered 2016-06-24 – 2016-06-30 (×11): 3 mL via INTRAVENOUS

## 2016-06-23 MED ORDER — MOVING RIGHT ALONG BOOK
Freq: Once | Status: DC
  Filled 2016-06-23: qty 1

## 2016-06-23 MED ORDER — INDAPAMIDE 1.25 MG PO TABS
1.2500 mg | ORAL_TABLET | Freq: Every day | ORAL | Status: DC
  Administered 2016-06-23 – 2016-06-30 (×8): 1.25 mg via ORAL
  Filled 2016-06-23 (×8): qty 1

## 2016-06-23 MED ORDER — MAGNESIUM HYDROXIDE 400 MG/5ML PO SUSP: 30.0000 mL | Freq: Every day | ORAL | Status: DC | PRN

## 2016-06-23 MED ORDER — AMIODARONE HCL 200 MG PO TABS
400.0000 mg | ORAL_TABLET | Freq: Two times a day (BID) | ORAL | Status: DC
  Administered 2016-06-23 – 2016-06-30 (×15): 400 mg via ORAL
  Filled 2016-06-23 (×15): qty 2

## 2016-06-23 MED ORDER — SODIUM CHLORIDE 0.9 % IV SOLN: 250.0000 mL | INTRAVENOUS | Status: DC | PRN

## 2016-06-23 MED ORDER — LOSARTAN POTASSIUM 25 MG PO TABS
25.0000 mg | ORAL_TABLET | Freq: Every day | ORAL | Status: DC
  Administered 2016-06-23 – 2016-06-24 (×2): 25 mg via ORAL
  Filled 2016-06-23 (×2): qty 1

## 2016-06-23 MED ORDER — SODIUM CHLORIDE 0.9% FLUSH: 3.0000 mL | INTRAVENOUS | Status: DC | PRN

## 2016-06-23 MED ORDER — ALUM & MAG HYDROXIDE-SIMETH 200-200-20 MG/5ML PO SUSP: 15.0000 mL | ORAL | Status: DC | PRN

## 2016-06-23 NOTE — Progress Notes (Signed)
3 Days Post-Op Procedure(s) (LRB): CORONARY ARTERY BYPASS GRAFTING (CABG)x4 EVH LEFT GREATER SAPHENOUS VEIN  LIMA-LAD SVG-PD SVG-DIAD L RADIAL-OM (N/A) RADIAL ARTERY HARVEST (Left) TRANSESOPHAGEAL ECHOCARDIOGRAM (TEE) (N/A) Subjective: Denies pain Appetite poor  Objective: Vital signs in last 24 hours: Temp:  [97.4 F (36.3 C)-98.4 F (36.9 C)] 98.4 F (36.9 C) (11/13 0400) Pulse Rate:  [54-140] 63 (11/13 0700) Cardiac Rhythm: Normal sinus rhythm (11/13 0600) Resp:  [13-30] 18 (11/13 0700) BP: (75-163)/(62-105) 140/82 (11/13 0700) SpO2:  [66 %-100 %] 100 % (11/13 0700) Weight:  [139 lb 1.8 oz (63.1 kg)] 139 lb 1.8 oz (63.1 kg) (11/13 0500)  Hemodynamic parameters for last 24 hours:    Intake/Output from previous day: 11/12 0701 - 11/13 0700 In: 1466 [I.V.:1416; IV Piggyback:50] Out: 900 [Urine:900] Intake/Output this shift: No intake/output data recorded.  General appearance: alert, cooperative and no distress Neurologic: intact Heart: regular rate and rhythm Lungs: diminished breath sounds left base Abdomen: normal findings: soft, non-tender  Lab Results:  Recent Labs  06/22/16 0345 06/23/16 0415  WBC 7.9 13.0*  HGB 8.2* 9.8*  HCT 24.4* 29.5*  PLT 103* 159   BMET:  Recent Labs  06/22/16 0345 06/23/16 0415  NA 129* 126*  K 3.8 4.9  CL 97* 94*  CO2 24 24  GLUCOSE 119* 138*  BUN 8 12  CREATININE 0.81 0.84  CALCIUM 7.7* 8.8*    PT/INR:  Recent Labs  06/20/16 1455  LABPROT 18.4*  INR 1.52   ABG    Component Value Date/Time   PHART 7.371 06/20/2016 2043   HCO3 29.4 (H) 06/20/2016 2043   TCO2 26 06/21/2016 1709   O2SAT 74.6 06/22/2016 0350   CBG (last 3)   Recent Labs  06/22/16 1133 06/22/16 1751 06/22/16 2126  GLUCAP 141* 126* 134*    Assessment/Plan: S/P Procedure(s) (LRB): CORONARY ARTERY BYPASS GRAFTING (CABG)x4 EVH LEFT GREATER SAPHENOUS VEIN  LIMA-LAD SVG-PD SVG-DIAD L RADIAL-OM (N/A) RADIAL ARTERY HARVEST  (Left) TRANSESOPHAGEAL ECHOCARDIOGRAM (TEE) (N/A) Plan for transfer to step-down: see transfer orders  CV- back in SR on amiodarone- change to PO  Hypertension- restart cozaar  RESP- LLL atelectasis persists- continue IS, flutter  RENAL- creatinine OK, hyponatremia a little worse  Dc lasix, check urine Na and osmolality  ENDO- CBG well controlled  Anemia secondary to ABL- improving,   Thrombocytopenia resolved  Deconditioning- continue cardiac rehab  Transfer to 2 west  Dc central line   LOS: 3 days    Loreli Slot 06/23/2016

## 2016-06-23 NOTE — Plan of Care (Signed)
Problem: Nutritional: Goal: Risk for body nutrition deficit will decrease Outcome: Not Progressing Pt with very poor appetite

## 2016-06-23 NOTE — Care Management Note (Signed)
Case Management Note  Patient Details  Name: Cheryl [name] MRN: [id number] Date of Birth: [birth year]  Subjective/Objective:      S/p CABG              Action/Plan:  PTA independent from home alone.  Pts daughter will stay with pt for at least a week and provide 24/7 supervision as recommended.  Post that time period ; pt has already made arrangements to temporarily move in with daughter in [place] if more supervision is needed.  CM will continue to follow for discharge needs   Expected Discharge Date:                  Expected Discharge Plan:  Home/Self Care  In-House Referral:     Discharge planning Services  CM Consult  Post Acute Care Choice:    Choice offered to:     DME Arranged:    DME Agency:     HH Arranged:    HH Agency:     Status of Service:  In process, will continue to follow  If discussed at Long Length of Stay Meetings, dates discussed:    Additional Comments:  [name], RN 06/23/2016, 9:59 AM

## 2016-06-23 NOTE — Progress Notes (Signed)
[facility] BRIEF SICU PROGRESS NOTE  3 Days Post-Op  S/P Procedure(s) (LRB): CORONARY ARTERY BYPASS GRAFTING (CABG)x4 EVH LEFT GREATER SAPHENOUS VEIN  LIMA-LAD SVG-PD SVG-DIAD L RADIAL-OM (N/A) RADIAL ARTERY HARVEST (Left) TRANSESOPHAGEAL ECHOCARDIOGRAM (TEE) (N/A)   Stable day Maintaining NSR on amiodarone BP trending up Breathing comfortably w/ O2 sats 100% on 2 L/min UOP adequate  Plan: Continue current plan  Purcell Nails, MD 06/23/2016 6:30 PM

## 2016-06-24 ENCOUNTER — Inpatient Hospital Stay (HOSPITAL_COMMUNITY): Payer: Medicare Other

## 2016-06-24 ENCOUNTER — Encounter (HOSPITAL_COMMUNITY): Payer: Self-pay | Admitting: Radiology

## 2016-06-24 HISTORY — PX: IR GENERIC HISTORICAL: IMG1180011

## 2016-06-24 LAB — BASIC METABOLIC PANEL
Anion gap: 8 (ref 5–15)
BUN: 14 mg/dL (ref 6–20)
CO2: 27 mmol/L (ref 22–32)
CREATININE: 0.72 mg/dL (ref 0.44–1.00)
Calcium: 9.2 mg/dL (ref 8.9–10.3)
Chloride: 96 mmol/L — ABNORMAL LOW (ref 101–111)
GFR calc Af Amer: >60 mL/min (ref >60)
GLUCOSE: 109 mg/dL — AB (ref 65–99)
POTASSIUM: 4.7 mmol/L (ref 3.5–5.1)
SODIUM: 131 mmol/L — AB (ref 135–145)

## 2016-06-24 LAB — CBC
HCT: 30.3 % — ABNORMAL LOW (ref 36.0–46.0)
Hemoglobin: 9.9 g/dL — ABNORMAL LOW (ref 12.0–15.0)
MCH: 29.1 pg (ref 26.0–34.0)
MCHC: 32.7 g/dL (ref 30.0–36.0)
MCV: 89.1 fL (ref 78.0–100.0)
PLATELETS: 158 10*3/uL (ref 150–400)
RBC: 3.4 MIL/uL — AB (ref 3.87–5.11)
RDW: 13.9 % (ref 11.5–15.5)
WBC: 9.5 10*3/uL (ref 4.0–10.5)

## 2016-06-24 LAB — GLUCOSE, CAPILLARY
GLUCOSE-CAPILLARY: 96 mg/dL (ref 65–99)
Glucose-Capillary: 98 mg/dL (ref 65–99)
Glucose-Capillary: 80 mg/dL (ref 65–99)
Glucose-Capillary: 93 mg/dL (ref 65–99)

## 2016-06-24 MED ORDER — LIDOCAINE HCL 1 % IJ SOLN
INTRAMUSCULAR | Status: DC | PRN
  Administered 2016-06-24: 5 mL

## 2016-06-24 MED ORDER — SODIUM CHLORIDE 0.9% FLUSH
10.0000 mL | INTRAVENOUS | Status: DC | PRN
  Administered 2016-06-26 – 2016-06-28 (×4): 10 mL
  Filled 2016-06-24 (×4): qty 40

## 2016-06-24 MED ORDER — LIDOCAINE HCL 1 % IJ SOLN
INTRAMUSCULAR | Status: AC
  Filled 2016-06-24: qty 20

## 2016-06-24 MED ORDER — LEVALBUTEROL HCL 0.63 MG/3ML IN NEBU: 0.6300 mg | INHALATION_SOLUTION | Freq: Once | RESPIRATORY_TRACT | Status: DC

## 2016-06-24 MED ORDER — LEVALBUTEROL HCL 0.63 MG/3ML IN NEBU
INHALATION_SOLUTION | RESPIRATORY_TRACT | Status: AC
  Administered 2016-06-24: 0.63 mg
  Filled 2016-06-24: qty 3

## 2016-06-24 NOTE — Plan of Care (Signed)
To IR via bed, monitor and O2

## 2016-06-24 NOTE — Progress Notes (Signed)
4 Days Post-Op Procedure(s) (LRB): CORONARY ARTERY BYPASS GRAFTING (CABG)x4 EVH LEFT GREATER SAPHENOUS VEIN  LIMA-LAD SVG-PD SVG-DIAD L RADIAL-OM (N/A) RADIAL ARTERY HARVEST (Left) TRANSESOPHAGEAL ECHOCARDIOGRAM (TEE) (N/A) Subjective: Had some shortness of breath overnight, has ambulated already this AM  Objective: Vital signs in last 24 hours: Temp:  [97.4 F (36.3 C)-98 F (36.7 C)] 97.7 F (36.5 C) (11/14 0754) Pulse Rate:  [55-77] 73 (11/14 0800) Cardiac Rhythm: Normal sinus rhythm (11/14 0801) Resp:  [12-25] 25 (11/14 0800) BP: (111-163)/(48-102) 154/84 (11/14 0800) SpO2:  [96 %-100 %] 100 % (11/14 0800) Weight:  [138 lb 0.1 oz (62.6 kg)] 138 lb 0.1 oz (62.6 kg) (11/14 0430)  Hemodynamic parameters for last 24 hours:    Intake/Output from previous day: 11/13 0701 - 11/14 0700 In: 536.8 [P.O.:390; I.V.:146.8] Out: 425 [Urine:425] Intake/Output this shift: No intake/output data recorded.  General appearance: alert, cooperative and no distress Neurologic: intact Heart: regular rate and rhythm Lungs: diminished breath sounds left base Abdomen: normal findings: soft, non-tender Extremities: no edema  Lab Results:  Recent Labs  06/23/16 0415 06/24/16 0229  WBC 13.0* 9.5  HGB 9.8* 9.9*  HCT 29.5* 30.3*  PLT 159 158   BMET:  Recent Labs  06/23/16 0415 06/24/16 0229  NA 126* 131*  K 4.9 4.7  CL 94* 96*  CO2 24 27  GLUCOSE 138* 109*  BUN 12 14  CREATININE 0.84 0.72  CALCIUM 8.8* 9.2    PT/INR: No results for input(s): LABPROT, INR in the last 72 hours. ABG    Component Value Date/Time   PHART 7.371 06/20/2016 2043   HCO3 29.4 (H) 06/20/2016 2043   TCO2 26 06/21/2016 1709   O2SAT 74.6 06/22/2016 0350   CBG (last 3)   Recent Labs  06/23/16 1611 06/23/16 2159 06/24/16 0753  GLUCAP 98 119* 96    Assessment/Plan: S/P Procedure(s) (LRB): CORONARY ARTERY BYPASS GRAFTING (CABG)x4 EVH LEFT GREATER SAPHENOUS VEIN  LIMA-LAD SVG-PD SVG-DIAD L  RADIAL-OM (N/A) RADIAL ARTERY HARVEST (Left) TRANSESOPHAGEAL ECHOCARDIOGRAM (TEE) (N/A) -Awaiting bed on 2 west CV- in SR on amiodarone  BP better now that cozaar restarted  RESP_ PA lateral CXR shows a moderate left effusion  Will get left thoracentesis today  RENAL- creatinine OK, hyponatremia improved off lasix, follow fluid status  ENDO_ CBG well controlled  Continue cardiac rehab  Hold enoxaparin for thoracentesis   LOS: 4 days    Loreli Slot 06/24/2016

## 2016-06-24 NOTE — Plan of Care (Signed)
Back from IR

## 2016-06-24 NOTE — Progress Notes (Signed)
CT Surgery PM Rounds  Patient examined and record reviewed.Hemodynamics stable,labs satisfactory.Patient had stable day Waiting for transfer to step down bed .Continue current care. Kathlee Nations Trigt III 06/24/2016

## 2016-06-24 NOTE — Procedures (Signed)
Successful US guided left thoracentesis. Yielded 550 mL of serosanguinous fluid. Pt tolerated procedure well. No immediate complications.  Specimen was not sent for labs. CXR ordered.  Brayton El PA-C 06/24/2016 1:05 PM

## 2016-06-25 LAB — BASIC METABOLIC PANEL
Anion gap: 11 (ref 5–15)
BUN: 16 mg/dL (ref 6–20)
CALCIUM: 9.3 mg/dL (ref 8.9–10.3)
CHLORIDE: 94 mmol/L — AB (ref 101–111)
CO2: 24 mmol/L (ref 22–32)
CREATININE: 0.65 mg/dL (ref 0.44–1.00)
Glucose, Bld: 99 mg/dL (ref 65–99)
Potassium: 4.9 mmol/L (ref 3.5–5.1)
SODIUM: 129 mmol/L — AB (ref 135–145)

## 2016-06-25 LAB — GLUCOSE, CAPILLARY
GLUCOSE-CAPILLARY: 101 mg/dL — AB (ref 65–99)
GLUCOSE-CAPILLARY: 106 mg/dL — AB (ref 65–99)
GLUCOSE-CAPILLARY: 117 mg/dL — AB (ref 65–99)
Glucose-Capillary: 88 mg/dL (ref 65–99)

## 2016-06-25 LAB — CBC
HCT: 32.9 % — ABNORMAL LOW (ref 36.0–46.0)
HEMOGLOBIN: 10.8 g/dL — AB (ref 12.0–15.0)
MCH: 29.3 pg (ref 26.0–34.0)
MCHC: 32.8 g/dL (ref 30.0–36.0)
MCV: 89.4 fL (ref 78.0–100.0)
PLATELETS: 201 10*3/uL (ref 150–400)
RBC: 3.68 MIL/uL — ABNORMAL LOW (ref 3.87–5.11)
RDW: 13.9 % (ref 11.5–15.5)
WBC: 9.6 10*3/uL (ref 4.0–10.5)

## 2016-06-25 MED ORDER — FUROSEMIDE 20 MG PO TABS
20.0000 mg | ORAL_TABLET | Freq: Every day | ORAL | Status: DC
  Administered 2016-06-25: 20 mg via ORAL
  Filled 2016-06-25: qty 1

## 2016-06-25 MED ORDER — LOSARTAN POTASSIUM 50 MG PO TABS
50.0000 mg | ORAL_TABLET | Freq: Every day | ORAL | Status: DC
  Administered 2016-06-25 – 2016-06-27 (×3): 50 mg via ORAL
  Filled 2016-06-25 (×3): qty 1

## 2016-06-25 MED ORDER — POTASSIUM CHLORIDE ER 10 MEQ PO TBCR
10.0000 meq | EXTENDED_RELEASE_TABLET | Freq: Every day | ORAL | Status: DC
  Administered 2016-06-25: 10 meq via ORAL
  Filled 2016-06-25 (×2): qty 1

## 2016-06-25 MED ORDER — CARVEDILOL 3.125 MG PO TABS
3.1250 mg | ORAL_TABLET | Freq: Two times a day (BID) | ORAL | Status: DC
  Administered 2016-06-25 – 2016-06-26 (×3): 3.125 mg via ORAL
  Filled 2016-06-25 (×3): qty 1

## 2016-06-25 NOTE — Progress Notes (Signed)
Pt transferred via wheelchair to 2W21, vss, all pt belongings at side, receiving RN at bedside, daughter Bonita Quin made aware of move to [address], pt settled in chair eating breakfast.  Darrel Hoover

## 2016-06-25 NOTE — Progress Notes (Signed)
CARDIAC REHAB PHASE I   PRE:  Rate/Rhythm: 74 SR  BP:  Supine:   Sitting: 155/85  Standing:    SaO2: 96%RA  MODE:  Ambulation: 190 ft   POST:  Rate/Rhythm: 100 SR PACs  BP:  Supine:   Sitting: 160/91  Standing:    SaO2: 96%RA 1300-1340 Pt walked 190 ft on RA with gait belt use, rolling walker and asst x 1. Tolerated well but a little dyspneic at end of walk. To bed with call bell. Sats good on RA.   Luetta Nutting, RN BSN  06/25/2016 1:37 PM

## 2016-06-25 NOTE — Progress Notes (Signed)
5 Days Post-Op Procedure(s) (LRB): CORONARY ARTERY BYPASS GRAFTING (CABG)x4 EVH LEFT GREATER SAPHENOUS VEIN  LIMA-LAD SVG-PD SVG-DIAD L RADIAL-OM (N/A) RADIAL ARTERY HARVEST (Left) TRANSESOPHAGEAL ECHOCARDIOGRAM (TEE) (N/A) Subjective: Feels like she is breathing better after thoracentesis  Objective: Vital signs in last 24 hours: Temp:  [97.5 F (36.4 C)-98.5 F (36.9 C)] 98.5 F (36.9 C) (11/15 0400) Pulse Rate:  [46-80] 74 (11/15 0800) Cardiac Rhythm: Normal sinus rhythm;Bundle branch block (11/15 0800) Resp:  [14-25] 17 (11/15 0800) BP: (92-160)/(52-94) 160/93 (11/15 0800) SpO2:  [91 %-100 %] 99 % (11/15 0800) Weight:  [136 lb 11 oz (62 kg)] 136 lb 11 oz (62 kg) (11/15 0500)  Hemodynamic parameters for last 24 hours:    Intake/Output from previous day: 11/14 0701 - 11/15 0700 In: 1020 [P.O.:1020] Out: 502 [Urine:500; Stool:2] Intake/Output this shift: No intake/output data recorded.  General appearance: alert, cooperative and no distress Neurologic: intact Heart: regular rate and rhythm Lungs: improved BS at left base, crackles bilaterally Abdomen: normal findings: soft, non-tender Wound: clean and dry  Lab Results:  Recent Labs  06/24/16 0229 06/25/16 0251  WBC 9.5 9.6  HGB 9.9* 10.8*  HCT 30.3* 32.9*  PLT 158 201   BMET:  Recent Labs  06/24/16 0229 06/25/16 0251  NA 131* 129*  K 4.7 4.9  CL 96* 94*  CO2 27 24  GLUCOSE 109* 99  BUN 14 16  CREATININE 0.72 0.65  CALCIUM 9.2 9.3    PT/INR: No results for input(s): LABPROT, INR in the last 72 hours. ABG    Component Value Date/Time   PHART 7.371 06/20/2016 2043   HCO3 29.4 (H) 06/20/2016 2043   TCO2 26 06/21/2016 1709   O2SAT 74.6 06/22/2016 0350   CBG (last 3)   Recent Labs  06/24/16 1120 06/24/16 1551 06/24/16 2141  GLUCAP 98 80 93    Assessment/Plan: S/P Procedure(s) (LRB): CORONARY ARTERY BYPASS GRAFTING (CABG)x4 EVH LEFT GREATER SAPHENOUS VEIN  LIMA-LAD SVG-PD SVG-DIAD L  RADIAL-OM (N/A) RADIAL ARTERY HARVEST (Left) TRANSESOPHAGEAL ECHOCARDIOGRAM (TEE) (N/A) Plan for transfer to step-down: see transfer orders  CV- in SR on amiodarone. Avoid anticoagulation if no further a fib due to bleeding risk  Change metoprolol to coreg due to low EF  RESP- s/p thoracentesis- 550 ml drained  Continue IS and flutter  RENAL- creatinine OK, sodium down slightly , needs gentle diuresis  ENDO- CBG well controlled  Anemia secondary to ABL- follow  Continue cardiac rehab   LOS: 5 days    Loreli Slot 06/25/2016

## 2016-06-25 NOTE — Progress Notes (Signed)
Patient lying in bed, O2 sats were WDL however she stated that she felt short of breath and was sweating. Blood sugar is WDL as well. Patient had on two gowns and under blankets. Toileted, gown changed and repositioned in bed. Patient also put back on O2 to lower her anxiety. Will continue to monitor, bed alarm on, call light within reach.

## 2016-06-25 NOTE — Progress Notes (Signed)
Report attempted to be callled, RN will call back.  Louie Bun S 8:08 AM

## 2016-06-26 ENCOUNTER — Inpatient Hospital Stay (HOSPITAL_COMMUNITY): Payer: Medicare Other

## 2016-06-26 LAB — CBC
HCT: 31.8 % — ABNORMAL LOW (ref 36.0–46.0)
Hemoglobin: 10.6 g/dL — ABNORMAL LOW (ref 12.0–15.0)
MCH: 30 pg (ref 26.0–34.0)
MCHC: 33.3 g/dL (ref 30.0–36.0)
MCV: 90.1 fL (ref 78.0–100.0)
PLATELETS: 152 10*3/uL (ref 150–400)
RBC: 3.53 MIL/uL — AB (ref 3.87–5.11)
RDW: 14.4 % (ref 11.5–15.5)
WBC: 5.9 10*3/uL (ref 4.0–10.5)

## 2016-06-26 LAB — GLUCOSE, CAPILLARY
GLUCOSE-CAPILLARY: 91 mg/dL (ref 65–99)
GLUCOSE-CAPILLARY: 116 mg/dL — AB (ref 65–99)
GLUCOSE-CAPILLARY: 70 mg/dL (ref 65–99)
Glucose-Capillary: 92 mg/dL (ref 65–99)

## 2016-06-26 MED ORDER — TRAMADOL HCL 50 MG PO TABS: 50.0000 mg | ORAL_TABLET | ORAL | Status: DC | PRN

## 2016-06-26 MED ORDER — FUROSEMIDE 40 MG PO TABS
40.0000 mg | ORAL_TABLET | Freq: Every day | ORAL | Status: DC
  Administered 2016-06-26 – 2016-06-30 (×5): 40 mg via ORAL
  Filled 2016-06-26 (×5): qty 1

## 2016-06-26 NOTE — Progress Notes (Addendum)
      [address]       [place] [address]             [phone]        6 Days Post-Op Procedure(s) (LRB): CORONARY ARTERY BYPASS GRAFTING (CABG)x4 EVH LEFT GREATER SAPHENOUS VEIN  LIMA-LAD SVG-PD SVG-DIAD L RADIAL-OM (N/A) RADIAL ARTERY HARVEST (Left) TRANSESOPHAGEAL ECHOCARDIOGRAM (TEE) (N/A)  Subjective: Patient states she had an "awful night". She had an episode around 3 am where she could not catch her breath , had shortness of breath. She was put on oxygen, although oxygen saturation was good.  Objective: Vital signs in last 24 hours: Temp:  [97.7 F (36.5 C)-98.2 F (36.8 C)] 98.2 F (36.8 C) (11/16 0346) Pulse Rate:  [74-85] 74 (11/16 0346) Cardiac Rhythm: Other (Comment) (11/15 1900) Resp:  [17-20] 20 (11/16 0346) BP: (148-174)/(85-93) 174/90 (11/16 0346) SpO2:  [98 %-100 %] 100 % (11/16 0346) Weight:  [135 lb 6.4 oz (61.4 kg)] 135 lb 6.4 oz (61.4 kg) (11/16 0346)  Pre op weight 57.6 kg Current Weight  06/26/16 135 lb 6.4 oz (61.4 kg)      Intake/Output from previous day: 11/15 0701 - 11/16 0700 In: 240 [P.O.:240] Out: -    Physical Exam:  Cardiovascular: RRR Pulmonary: Diminished at left base Abdomen: Soft, non tender, bowel sounds present. Extremities: Mild bilateral lower extremity edema. Wounds: Clean and dry.  No erythema or signs of infection.  Lab Results: CBC: Recent Labs  06/25/16 0251 06/26/16 0500  WBC 9.6 5.9  HGB 10.8* 10.6*  HCT 32.9* 31.8*  PLT 201 152   BMET:  Recent Labs  06/24/16 0229 06/25/16 0251  NA 131* 129*  K 4.7 4.9  CL 96* 94*  CO2 27 24  GLUCOSE 109* 99  BUN 14 16  CREATININE 0.72 0.65  CALCIUM 9.2 9.3    PT/INR:  Lab Results  Component Value Date   INR 1.52 06/20/2016   INR 1.09 06/19/2016   INR 1.10 06/12/2016   ABG:  INR: Will add last result for INR, ABG once components are confirmed Will add last 4 CBG results once components are confirmed  Assessment/Plan:  1. CV -  Had run of a fib with HR in the 120's around 4 am. SR in the 70's this am.On Amiodarone 400 mg bid, Coreg 3.125 mg bid, Imdur 15 mg daily, Losartan 50 mg daily, and Indapamide 1.25 mg daily. Will discuss with Dr. Dorris Fetch regarding anticoagulation. 2.  Pulmonary - She had left thoracentesis on 11/14. On 3 liters of oxygen via [place]. Will wean as tolerates. CXR this am appears to show moderate left pleural effusion, no pneumothorax. Encourage incentive spirometer 3. Volume Overload - On Lasix 20 mg daily but will give 40 mg today. 4.  Acute blood loss anemia - H and H stable at 10.6 and 31.8 5. CBGs 106/117/91.On Insulin. Pre op HGA1C 5.4. Stop accu checks and SS 6. Remove EPW  ZIMMERMAN,DONIELLE MPA-C 06/26/2016,7:38 AM Patient seen and examined, agree with above Will ask Cardiology to see re: a fib Her CXR is significantly improved post thoracentesis- continue IS and flutter BMET pending  Viviann Spare C. Dorris Fetch, MD [facility] [phone]

## 2016-06-26 NOTE — Progress Notes (Signed)
CARDIAC REHAB PHASE I   PRE:  Rate/Rhythm: 65 SR  BP:  Supine:   Sitting: 136/90  Standing:    SaO2: 100% 1L  MODE:  Ambulation: 260 ft   POST:  Rate/Rhythm: 75 SR  BP:  Supine:   Sitting: 137/76  Standing:    SaO2: 95% 2L 0945-1015 Pt walked 260 ft on 2L with gait belt use, rolling walker and asst x 2. Walked as asst x 2 since she had awful night but she was steady. Stopped a couple of times to rest but tolerated well. Back to chair and to 1L. Stated she is using IS frequently. Very motivated to get better. Can be asst x 1.   Luetta Nutting, RN BSN  06/26/2016 10:12 AM

## 2016-06-26 NOTE — Progress Notes (Signed)
EPW removed. Pt tolerated well. Pt educated on the importance of bed rest. Vital signs taken. Will continue to monitor pt.

## 2016-06-26 NOTE — Progress Notes (Signed)
Pt ambulated 176ft around circle. Pt on 2L O2  During ambulation. Pt 100% room air while sitting. Pt says she feels like she is having trouble to breathe. Pt encouraged to continue use of incentive spirometry and flutter valve. Will continue to monitor.

## 2016-06-27 DIAGNOSIS — Z951 Presence of aortocoronary bypass graft: Secondary | ICD-10-CM

## 2016-06-27 DIAGNOSIS — I2510 Atherosclerotic heart disease of native coronary artery without angina pectoris: Secondary | ICD-10-CM

## 2016-06-27 DIAGNOSIS — I481 Persistent atrial fibrillation: Secondary | ICD-10-CM

## 2016-06-27 DIAGNOSIS — I4819 Other persistent atrial fibrillation: Secondary | ICD-10-CM

## 2016-06-27 DIAGNOSIS — E876 Hypokalemia: Secondary | ICD-10-CM

## 2016-06-27 LAB — BASIC METABOLIC PANEL
Anion gap: 11 (ref 5–15)
BUN: 9 mg/dL (ref 6–20)
CO2: 28 mmol/L (ref 22–32)
CREATININE: 0.58 mg/dL (ref 0.44–1.00)
Calcium: 8.4 mg/dL — ABNORMAL LOW (ref 8.9–10.3)
Chloride: 90 mmol/L — ABNORMAL LOW (ref 101–111)
GFR calc Af Amer: >60 mL/min (ref >60)
Glucose, Bld: 101 mg/dL — ABNORMAL HIGH (ref 65–99)
POTASSIUM: 3 mmol/L — AB (ref 3.5–5.1)
SODIUM: 129 mmol/L — AB (ref 135–145)

## 2016-06-27 MED ORDER — SIMVASTATIN 10 MG PO TABS
5.0000 mg | ORAL_TABLET | Freq: Every day | ORAL | Status: DC
  Administered 2016-06-27 – 2016-06-29 (×3): 5 mg via ORAL
  Filled 2016-06-27 (×3): qty 1

## 2016-06-27 MED ORDER — LOSARTAN POTASSIUM 25 MG PO TABS
25.0000 mg | ORAL_TABLET | Freq: Every day | ORAL | Status: DC
  Administered 2016-06-28 – 2016-06-30 (×3): 25 mg via ORAL
  Filled 2016-06-27 (×3): qty 1

## 2016-06-27 MED ORDER — CARVEDILOL 6.25 MG PO TABS
6.2500 mg | ORAL_TABLET | Freq: Two times a day (BID) | ORAL | Status: DC
  Administered 2016-06-27 – 2016-06-30 (×7): 6.25 mg via ORAL
  Filled 2016-06-27 (×7): qty 1

## 2016-06-27 MED ORDER — GUAIFENESIN-DM 100-10 MG/5ML PO SYRP: 5.0000 mL | ORAL_SOLUTION | ORAL | Status: DC | PRN

## 2016-06-27 MED ORDER — POTASSIUM CHLORIDE CRYS ER 20 MEQ PO TBCR
40.0000 meq | EXTENDED_RELEASE_TABLET | Freq: Once | ORAL | Status: AC
  Administered 2016-06-27: 40 meq via ORAL
  Filled 2016-06-27: qty 2

## 2016-06-27 MED ORDER — APIXABAN 2.5 MG PO TABS
2.5000 mg | ORAL_TABLET | Freq: Two times a day (BID) | ORAL | Status: DC
  Administered 2016-06-27 – 2016-06-30 (×7): 2.5 mg via ORAL
  Filled 2016-06-27 (×7): qty 1

## 2016-06-27 NOTE — Discharge Instructions (Addendum)
Endoscopic Saphenous Vein Harvesting, Care After Introduction Refer to this sheet in the next few weeks. These instructions provide you with information about caring for yourself after your procedure. Your health care provider may also give you more specific instructions. Your treatment has been planned according to current medical practices, but problems sometimes occur. Call your health care provider if you have any problems or questions after your procedure. What can I expect after the procedure? After the procedure, it is common to have:  Pain.  Bruising.  Swelling.  Numbness. Follow these instructions at home: Medicine  Take over-the-counter and prescription medicines only as told by your health care provider.  Do not drive or operate heavy machinery while taking prescription pain medicine. Incision care  Follow instructions from your health care provider about how to take care of the cut made during surgery (incision). Make sure you:  Wash your hands with soap and water before you change your bandage (dressing). If soap and water are not available, use hand sanitizer.  Change your dressing as told by your health care provider.  Leave stitches (sutures), skin glue, or adhesive strips in place. These skin closures may need to be in place for 2 weeks or longer. If adhesive strip edges start to loosen and curl up, you may trim the loose edges. Do not remove adhesive strips completely unless your health care provider tells you to do that.  Check your incision area every day for signs of infection. Check for:  More redness, swelling, or pain.  More fluid or blood.  Warmth.  Pus or a bad smell. General instructions  Raise (elevate) your legs above the level of your heart while you are sitting or lying down.  Do any exercises your health care providers have given you. These may include deep breathing, coughing, and walking exercises.  Do not shower, take baths, swim, or use  a hot tub unless told by your health care provider.  Wear your elastic stocking if told by your health care provider.  Keep all follow-up visits as told by your health care provider. This is important. Contact a health care provider if:  Medicine does not help your pain.  Your pain gets worse.  You have new leg bruises or your leg bruises get bigger.  You have a fever.  Your leg feels numb.  You have more redness, swelling, or pain around your incision.  You have more fluid or blood coming from your incision.  Your incision feels warm to the touch.  You have pus or a bad smell coming from your incision. Get help right away if:  Your pain is severe.  You develop pain, tenderness, warmth, redness, or swelling in any part of your leg.  You have chest pain.  You have trouble breathing. This information is not intended to replace advice given to you by your health care provider. Make sure you discuss any questions you have with your health care provider. Document Released: 04/09/2011 Document Revised: 01/03/2016 Document Reviewed: 06/11/2015  2017 Elsevier Atrial Fibrillation Introduction Atrial fibrillation is a type of heartbeat that is irregular or fast (rapid). If you have this condition, your heart keeps quivering in a weird (chaotic) way. This condition can make it so your heart cannot pump blood normally. Having this condition gives a person more risk for stroke, heart failure, and other heart problems. There are different types of atrial fibrillation. Talk with your doctor to learn about the type that you have. Follow these instructions at  home:  Take over-the-counter and prescription medicines only as told by your doctor.  If your doctor prescribed a blood-thinning medicine, take it exactly as told. Taking too much of it can cause bleeding. If you do not take enough of it, you will not have the protection that you need against stroke and other problems.  Do not use  any tobacco products. These include cigarettes, chewing tobacco, and e-cigarettes. If you need help quitting, ask your doctor.  If you have apnea (obstructive sleep apnea), manage it as told by your doctor.  Do not drink alcohol.  Do not drink beverages that have caffeine. These include coffee, soda, and tea.  Maintain a healthy weight. Do not use diet pills unless your doctor says they are safe for you. Diet pills may make heart problems worse.  Follow diet instructions as told by your doctor.  Exercise regularly as told by your doctor.  Keep all follow-up visits as told by your doctor. This is important. Contact a doctor if:  You notice a change in the speed, rhythm, or strength of your heartbeat.  You are taking a blood-thinning medicine and you notice more bruising.  You get tired more easily when you move or exercise. Get help right away if:  You have pain in your chest or your belly (abdomen).  You have sweating or weakness.  You feel sick to your stomach (nauseous).  You notice blood in your throw up (vomit), poop (stool), or pee (urine).  You are short of breath.  You suddenly have swollen feet and ankles.  You feel dizzy.  Your suddenly get weak or numb in your face, arms, or legs, especially if it happens on one side of your body.  You have trouble talking, trouble understanding, or both.  Your face or your eyelid droops on one side. These symptoms may be an emergency. Do not wait to see if the symptoms will go away. Get medical help right away. Call your local emergency services (911 in the [place] Do not drive yourself to the hospital.  This information is not intended to replace advice given to you by your health care provider. Make sure you discuss any questions you have with your health care provider. Document Released: 05/06/2008 Document Revised: 01/03/2016 Document Reviewed: 11/22/2014  2017 Elsevier Coronary Artery Bypass Grafting, Care After Refer  to this sheet in the next few weeks. These instructions provide you with information on caring for yourself after your procedure. Your health care provider may also give you more specific instructions. Your treatment has been planned according to current medical practices, but problems sometimes occur. Call your health care provider if you have any problems or questions after your procedure. WHAT TO EXPECT AFTER THE PROCEDURE Recovery from surgery will be different for everyone. Some people feel well after 3 or 4 weeks, while for others it takes longer. After your procedure, it is typical to have the following:  Nausea and a lack of appetite.   Constipation.  Weakness and fatigue.   Depression or irritability.   Pain or discomfort at your incision site. HOME CARE INSTRUCTIONS  Take medicines only as directed by your health care provider. Do not stop taking medicines or start any new medicines without first checking with your health care provider.  Take your pulse as directed by your health care provider.  Perform deep breathing as directed by your health care provider. If you were given a device called an incentive spirometer, use it to practice deep breathing  several times a day. Support your chest with a pillow or your arms when you take deep breaths or cough.  Keep incision areas clean, dry, and protected. Remove or change any bandages (dressings) only as directed by your health care provider. You may have skin adhesive strips over the incision areas. Do not take the strips off. They will fall off on their own.  Check incision areas daily for any swelling, redness, or drainage.  If incisions were made in your legs, do the following:  Avoid crossing your legs.   Avoid sitting for long periods of time. Change positions every 30 minutes.   Elevate your legs when you are sitting.  Wear compression stockings as directed by your health care provider. These stockings help keep blood  clots from forming in your legs.  Take showers once your health care provider approves. Until then, only take sponge baths. Pat incisions dry. Do not rub incisions with a washcloth or towel. Do not take baths, swim, or use a hot tub until your health care provider approves.  Eat foods that are high in fiber, such as raw fruits and vegetables, whole grains, beans, and nuts. Meats should be lean cut. Avoid canned, processed, and fried foods.  Drink enough fluid to keep your urine clear or pale yellow.  Weigh yourself every day. This helps identify if you are retaining fluid that may make your heart and lungs work harder.  Rest and limit activity as directed by your health care provider. You may be instructed to:  Stop any activity at once if you have chest pain, shortness of breath, irregular heartbeats, or dizziness. Get help right away if you have any of these symptoms.  Move around frequently for short periods or take short walks as directed by your health care provider. Increase your activities gradually. You may need physical therapy or cardiac rehabilitation to help strengthen your muscles and build your endurance.  Avoid lifting, pushing, or pulling anything heavier than 10 lb (4.5 kg) for at least 6 weeks after surgery.  Do not drive until your health care provider approves.  Ask your health care provider when you may return to work.  Ask your health care provider when you may resume sexual activity.  Keep all follow-up visits as directed by your health care provider. This is important. SEEK MEDICAL CARE IF:  You have swelling, redness, increasing pain, or drainage at the site of an incision.  You have a fever.  You have swelling in your ankles or legs.  You have pain in your legs.   You gain 2 or more pounds (0.9 kg) a day.  You are nauseous or vomit.  You have diarrhea. SEEK IMMEDIATE MEDICAL CARE IF:  You have chest pain that goes to your jaw or arms.  You have  shortness of breath.   You have a fast or irregular heartbeat.   You notice a "clicking" in your breastbone (sternum) when you move.   You have numbness or weakness in your arms or legs.  You feel dizzy or light-headed.  MAKE SURE YOU:  Understand these instructions.  Will watch your condition.  Will get help right away if you are not doing well or get worse. This information is not intended to replace advice given to you by your health care provider. Make sure you discuss any questions you have with your health care provider. Document Released: 02/14/2005 Document Revised: 08/18/2014 Document Reviewed: 01/04/2013 Elsevier Interactive Patient Education  2017 [employer]. Information  on my medicine - ELIQUIS (apixaban)  This medication education was reviewed with me or my healthcare representative as part of my discharge preparation.  Why was Eliquis prescribed for you? Eliquis was prescribed for you to reduce the risk of a blood clot forming that can cause a stroke if you have a medical condition called atrial fibrillation (a type of irregular heartbeat).  What do You need to know about Eliquis ? Take your Eliquis TWICE DAILY - one tablet in the morning and one tablet in the evening with or without food. If you have difficulty swallowing the tablet whole please discuss with your pharmacist how to take the medication safely.  Take Eliquis exactly as prescribed by your doctor and DO NOT stop taking Eliquis without talking to the doctor who prescribed the medication.  Stopping may increase your risk of developing a stroke.  Refill your prescription before you run out.  After discharge, you should have regular check-up appointments with your healthcare provider that is prescribing your Eliquis.  In the future your dose may need to be changed if your kidney function or weight changes by a significant amount or as you get older.  What do you do if you miss a dose? If you  miss a dose, take it as soon as you remember on the same day and resume taking twice daily.  Do not take more than one dose of ELIQUIS at the same time to make up a missed dose.  Important Safety Information A possible side effect of Eliquis is bleeding. You should call your healthcare provider right away if you experience any of the following: ? Bleeding from an injury or your nose that does not stop. ? Unusual colored urine (red or dark brown) or unusual colored stools (red or black). ? Unusual bruising for unknown reasons. ? A serious fall or if you hit your head (even if there is no bleeding).  Some medicines may interact with Eliquis and might increase your risk of bleeding or clotting while on Eliquis. To help avoid this, consult your healthcare provider or pharmacist prior to using any new prescription or non-prescription medications, including herbals, vitamins, non-steroidal anti-inflammatory drugs (NSAIDs) and supplements.  This website has more information on Eliquis (apixaban): http://www.eliquis.com/eliquis/home

## 2016-06-27 NOTE — Discharge Summary (Signed)
Physician Discharge Summary  Patient ID: Cheryl [name] MRN: [id number] DOB/AGE: Mar 29, 1933 82 y.o.  Admit date: 06/20/2016 Discharge date: 06/30/2016  Admission Diagnoses:Severe three-vessel coronary disease with atypical angina and congestive heart failure with severe ischemic cardiomyopathy.  Discharge Diagnoses:  Active Problems:   S/P CABG x 4   Coronary artery disease involving native heart without angina pectoris   S/P CABG (coronary artery bypass graft)   Persistent atrial fibrillation [facility])  Patient Active Problem List   Diagnosis Date Noted  . Coronary artery disease involving native heart without angina pectoris   . S/P CABG (coronary artery bypass graft)   . Persistent atrial fibrillation (HCC)   . S/P CABG x 4 06/20/2016  . Atypical angina (HCC) 06/12/2016  . Abnormal nuclear stress test - high risk with low EF and large infarct with peri-infarct ischemia in the inferolateral Ziff 06/12/2016  . Cardiomyopathy, ischemic 06/12/2016  . Anemia 06/28/2013    HPI: Cheryl [name] is an 82 year old woman who is sent for consultation regarding three-vessel coronary disease.  Cheryl [name] is an 82 year old woman with a past medical history significant for hypertension, TIA, osteoporosis, arthritis, gastroesophageal reflux, and diverticulitis requiring colostomy and then colostomy takedown. She had no history of CAD known to her, although she says she did have a catheterization many years ago. She recently developed pain in her anterior left chest. This was relatively short-lived and she began having episodes of subscapular pain on the left side. This is not necessarily related to exertion. She saw Dr. Juanetta Gosling. EKG showed changes. She was referred to Dr. Eden Emms. He did a stress Myoview which showed a large severe defect consistent with an old myocardial infarction with peri-infarct ischemia. An echocardiogram showed severe left ventricular dysfunction with ejection fraction of 25%. There  was some sclerosis but no stenosis of the aortic valve. Dr. Herbie Baltimore performed cardiac catheterization which revealed severe three-vessel coronary disease with total occlusion of the right coronary and circumflex and a 70% stenosis in the LAD with an 80% stenosis in a large first diagonal. There was no significant gradient across the aortic valve.  She has continued to have subscapular pain intermittently and sporadically. She had a brief episode last night that lasted only minutes. She does complain of shortness of breath when she tries to lay flat and has been sleeping in a recliner. She has developed numbness in her feet over the past 3 weeks. She and her daughter thinks this is related to starting furosemide.  She had a possible TIA many years ago. She has been on clopidogrel for that. She has not had any neurologic symptoms recently other than the numbness in her feet. Her clopidogrel was stopped after her catheterization.  She had laser ablation/stab phlebectomy of her right greater saphenous vein and tributaries by Dr. Arbie Cookey in 2008  She does remain fairly independent and lives by herself. She does drive to the grocery store and take care of her own household.  She was admitted electively for coronary artery bypass grafting.   Discharged Condition: good  Hospital Course: The patient was admitted electively and on 06/20/2016 was taken to the operating room where she underwent the below described procedure. She tolerated the procedure well and was taken to the surgical intensive care unit in good condition.  Post operative hospital course: The patient has overall progressed well. She did initially require some inotropic support which was weaned over time. She was weaned from the ventilator without difficulty using standard protocols. She has had postoperative  atrial fibrillation. She is on amiodarone and Coreg but has continued to have some intermittent episodes. Cardiology is also being  consulted. She is also started on Eliquis.  She is currently maintaining NSR and her pacing wires were removed without difficulty. She did develop a moderate-sized left pleural effusion and underwent thoracentesis by interventional radiology. She did have a postoperative thrombocytopenia which has resolved. She has an expected acute blood loss anemia and values have stabilized. Renal function is within normal limits. Blood glucose has been under adequate control using standard measures. She has had some hyponatremia and was treated with additional potassium supplementation.  She is on Imdur or for radial artery graft. Incisions are noted to be healing well without evidence of infection. She is tolerating gradually increasing activities with cardiac rehabilitation. Oxygen has been  weaned. At time of discharge she is felt to be quite stable.   Consults: cardiology  Significant Diagnostic Studies: routine post op serial labs and CXRs  Treatments: surgery:  DATE OF PROCEDURE:  06/20/2016 DATE OF DISCHARGE:                              OPERATIVE REPORT   PREOPERATIVE DIAGNOSIS:  Severe three-vessel coronary disease with atypical angina and congestive heart failure with severe ischemic cardiomyopathy.  POSTOPERATIVE DIAGNOSIS:  Severe three-vessel coronary disease with atypical angina and congestive heart failure with severe ischemic cardiomyopathy.  PROCEDURE:  Median sternotomy, extracorporeal circulation, coronary artery bypass grafting x4 (left internal mammary artery to LAD, left radial artery to OM 1, saphenous vein graft to first diagonal, saphenous vein graft to posterior descending), endoscopic vein harvest, left thigh.  SURGEON:  Salvatore Decent. Dorris Fetch, M.D.  FIRST ASSISTANT:  Rowe Clack, P.A.-C.  ANESTHESIA:  General.  FINDINGS:  Transesophageal echocardiography showed global hypokinesis with ejection fraction of 25%-30%.  There were mild aortic insufficiency and  mild mitral insufficiency.  Good quality coronary targets.  LIMA good quality.  Radial and saphenous vein fair quality.  Discharge Exam: Blood pressure 133/76, pulse 62, temperature 97.7 F (36.5 C), temperature source Oral, resp. rate 18, height 5' (1.524 m), weight 129 lb 3.2 oz (58.6 kg), SpO2 97 %.  General appearance: alert, cooperative and no distress Heart: regular rate and rhythm Lungs: dim in bases Abdomen: benign Extremities: no edema Wound: incis healing well l hand - neuro intact Disposition: 01-Home or Self Care     Medication List    STOP taking these medications   levofloxacin 500 MG tablet Commonly known as:  LEVAQUIN   nitroGLYCERIN 0.4 MG SL tablet Commonly known as:  NITROSTAT   sodium chloride 0.65 % Soln nasal spray Commonly known as:  OCEAN     TAKE these medications   alendronate 70 MG tablet Commonly known as:  FOSAMAX Take 70 mg by mouth once a week. Takes on Sunday   amiodarone 200 MG tablet Commonly known as:  PACERONE Take 1 tablet (200 mg total) by mouth 2 (two) times daily.   apixaban 2.5 MG Tabs tablet Commonly known as:  ELIQUIS Take 1 tablet (2.5 mg total) by mouth 2 (two) times daily.   aspirin EC 81 MG tablet Take 81 mg by mouth daily.   carvedilol 6.25 MG tablet Commonly known as:  COREG Take 1 tablet (6.25 mg total) by mouth 2 (two) times daily with a meal.   cholecalciferol 1000 units tablet Commonly known as:  VITAMIN D Take 1,000 Units by mouth daily.  ferrous sulfate 325 (65 FE) MG tablet Take 325 mg by mouth daily with breakfast.   furosemide 20 MG tablet Commonly known as:  LASIX Take 1 tablet (20 mg total) by mouth daily. What changed:  how much to take   indapamide 1.25 MG tablet Commonly known as:  LOZOL Take 1.25 mg by mouth daily.   isosorbide mononitrate 30 MG 24 hr tablet Commonly known as:  IMDUR Take 0.5 tablets (15 mg total) by mouth daily.   levothyroxine 50 MCG tablet Commonly known as:   SYNTHROID, LEVOTHROID Take 50 mcg by mouth daily before breakfast.   losartan 25 MG tablet Commonly known as:  COZAAR Take 1 tablet (25 mg total) by mouth daily.   pantoprazole 40 MG tablet Commonly known as:  PROTONIX Take 40 mg by mouth daily.   potassium chloride 10 MEQ CR capsule Commonly known as:  MICRO-K Take 10 mEq by mouth 2 (two) times daily.   simvastatin 5 MG tablet Commonly known as:  ZOCOR Take 1 tablet (5 mg total) by mouth daily at 6 PM.   traMADol 50 MG tablet Commonly known as:  ULTRAM Take 1 tablet (50 mg total) by mouth every 6 (six) hours as needed for moderate pain.   vitamin C 500 MG tablet Commonly known as:  ASCORBIC ACID Take 500 mg by mouth daily.            Durable Medical Equipment        Start     Ordered   06/29/16 [phone]  For home use only DME Walker rolling  Once    Question:  Patient needs a walker to treat with the following condition  Answer:  Physical deconditioning   06/29/16 0844     Follow-up Information    Loreli SlotSteven C Rexann Lueras, MD Follow up.   Specialty:  Cardiothoracic Surgery Why:  Appointment see Dr. Dorris FetchHendrickson on 07/22/2016 at 12:45 PM. [facility] at 12:15 PM. [facility] located in the same office complex. Contact information: [address] [place] [address] [phone]        Joni ReiningKathryn Lawrence, NP Follow up on 07/17/2016.   Specialties:  Nurse Practitioner, Radiology, Cardiology Why:  @ 1:50pm Contact information: [address] [place] [address] [phone]          The patient has been discharged on:   1.Beta Blocker:  Yes Patrice.Shin[yy   ]                              No   [   ]                              If No, reason:  2.Ace Inhibitor/ARB: Yes [  y ]                                     No  [    ]                                     If No, reason:  3.Statin:   Yes [ y  ]  No  [   ]                  If No,  reason:  4.Ecasa:  Yes  [  y ]                  No   [   ]                  If No, reason:  Signed: GOLD,WAYNE E 06/30/2016, 7:48 AM

## 2016-06-27 NOTE — Progress Notes (Signed)
ANTICOAGULATION CONSULT NOTE  Pharmacy Consult for apixaban Indication: atrial fibrillation   Assessment: 14 yof with new afib, s/p CABGx4 this admit. Pharmacy consulted to start apixaban. Will start lower dose due to advanced age and weight right at the threshold for dosage reduction (60.1 kg currently, was <60kg on admit 11/10). Renal function and CBC stable wnl. No bleeding documented.   Goal of Therapy:  Stroke prevention Monitor platelets by anticoagulation protocol: Yes   Plan:  Apixaban 2.5mg  PO BID Monitor CBC, s/sx bleeding   Cheryl [name], PharmD, [facility] Clinical Pharmacist Pager [phone] 06/27/2016 9:55 AM

## 2016-06-27 NOTE — Consult Note (Signed)
CARDIOLOGY CONSULT NOTE   Patient ID: Cheryl [name] MRN: [id number] DOB/AGE: [birth year] 82 y.o.  Admit date: 06/20/2016  Primary Physician   Fredirick Maudlin, MD Primary Cardiologist   Dr. Eden Emms Reason for Consultation  Post op afib Requesting Physician  Dr. Dorris Fetch  HPI: Cheryl [name] is a 82 y.o. female with a history of COPD, HTN, HLD, TIA and CABG x 4 this admission who consulted for afib.   First seen by Dr. Eden Emms 05/23/16 for chest pain and abnormal EKG.No prior hx of CAD.  F/u stress test showed Large defect of severe severity in the basal inferior septal, inferior septal, basal inferior, basal inferior lateral, mid and area septal, mid inferior, mid inferior lateral, apical inferior and apical lateral location. This is a high risk study. Subsequent echocardiogram confirmed reduced LV systolic function demonstrating EF of 25-30% with grade 1 diastolic dysfunction.  She underwent cardiac catheterization 06/12/16 by Dr. Herbie Baltimore that showed severe 3-vessel CAD with chronically occluded circumflex and RCA both filling via collaterals. The LAD itself is a long tubular 70% as well as the diagonal having focal 80%. Subsequently underwent CABG x 4 (LIMA-LAD, SVG-PD, SVG-1st DIAG, L RADIAL-OM 1 ) on 06/20/16. The patient went into afib RVR 06/22/16 after walking and started on IV amiodarone--> converted to NSR and changed amiodarone to po. The patient had moderate left effusion on 06/24/16 s/p successful US guided left thoracentesis. Yielded 550 mL of serosanguinous fluid. The patient again going in and out of afib since 06/25/16. Intermittently rate is high. Coreg increased to 6.25mg  BID today. On Amiodarone  BID. Plan to start Eliquis 2.5mg  BID today. K of 3.0. Hgb 10.6.   CXR today showed Persistent left lower lobe atelectasis and small left pleural effusion. No CHF or pneumothorax.   Past Medical History:  Diagnosis Date  . Anginal pain (HCC)   . Anxiety   . Bulging discs    . COPD (chronic obstructive pulmonary disease) (HCC)   . Coronary artery disease   . Degenerative disc disease   . Diverticulitis    complicated, with microperforation, sigmoid stricture  . Dyspnea    more at night  . GERD (gastroesophageal reflux disease)   . Hypertension   . Hypothyroidism   . Myocardial infarction   . Stroke [facility])    tia's     Past Surgical History:  Procedure Laterality Date  . APPENDECTOMY    . CARDIAC CATHETERIZATION N/A 06/12/2016   Procedure: Left Heart Cath and Coronary Angiography;  Surgeon: Marykay Lex, MD;  Location: [facility] INVASIVE CV LAB;  Service: Cardiovascular;  Laterality: N/A;  . COCCYX REMOVAL    . COLONOSCOPY  June 2014   Dr. Aliene Beams in [place]: via colostomy and rectum. scattered diverticula. no obvious polyps. Hemorrhoids noted in anal canal. Exam limited due to poor prep  . COLOSTOMY  Dec 13, 2012   Dec 13, 2012 placed. Reversed   . CORONARY ARTERY BYPASS GRAFT N/A 06/20/2016   Procedure: CORONARY ARTERY BYPASS GRAFTING (CABG)x4 EVH LEFT GREATER SAPHENOUS VEIN  LIMA-LAD SVG-PD SVG-DIAD L RADIAL-OM;  Surgeon: Loreli Slot, MD;  Location: [facility] OR;  Service: Open Heart Surgery;  Laterality: N/A;  . ESOPHAGOGASTRODUODENOSCOPY N/A 07/01/2013   Procedure: ESOPHAGOGASTRODUODENOSCOPY (EGD);  Surgeon: West Bali, MD;  Location: [facility] ENDO SUITE;  Service: Endoscopy;  Laterality: N/A;  3:00PM  . FLEXIBLE SIGMOIDOSCOPY  December 08, 2012   Dr. Kerby Less in [place]: very edematous sigmoid colon, unable to advance scope past 25-30  cm despite attempt with peds scope.   . IR GENERIC HISTORICAL  06/24/2016   IR THORACENTESIS ASP PLEURAL SPACE W/IMG GUIDE 06/24/2016 Brayton ElKevin Bruning, PA-C [facility]-INTERV RAD  . KNEE ARTHROSCOPY Right   . LAPAROSCOPIC LOW ANTERIOR RESECTION  summer 2014   with reversal of colostomy  . LAPAROSCOPY  May 2014  . RADIAL ARTERY HARVEST Left 06/20/2016   Procedure: RADIAL ARTERY HARVEST;  Surgeon: Loreli SlotSteven C Hendrickson, MD;   [facility];  Service: Open Heart Surgery;  Laterality: Left;  . TEE WITHOUT CARDIOVERSION N/A 06/20/2016   Procedure: TRANSESOPHAGEAL ECHOCARDIOGRAM (TEE);  Surgeon: Loreli SlotSteven C Hendrickson, MD;  [facility];  Service: Open Heart Surgery;  Laterality: N/A;    Allergies  Allergen Reactions  . Penicillins Hives    Has patient had a PCN reaction causing immediate rash, facial/tongue/throat swelling, SOB or lightheadedness with hypotension: Yes Has patient had a PCN reaction causing severe rash involving mucus membranes or skin necrosis: Yes Has patient had a PCN reaction that required hospitalization No Has patient had a PCN reaction occurring within the last 10 years: No If all of the above answers are "NO", then may proceed with Cephalosporin use.     I have reviewed the patient's current medications . amiodarone  400 mg Oral BID  . apixaban  2.5 mg Oral BID  . aspirin EC  81 mg Oral Daily  . bisacodyl  10 mg Oral Daily   Or  . bisacodyl  10 mg Rectal Daily  . carvedilol  6.25 mg Oral BID WC  . docusate sodium  200 mg Oral Daily  . furosemide  40 mg Oral Daily  . indapamide  1.25 mg Oral Daily  . isosorbide mononitrate  15 mg Oral Daily  . levalbuterol  0.63 mg Nebulization Once  . levothyroxine  50 mcg Oral QAC breakfast  . losartan  50 mg Oral Daily  . mouth rinse  15 mL Mouth Rinse BID  . moving right along book   Does not apply Once  . pantoprazole  40 mg Oral Daily  . simvastatin  5 mg Oral q1800  . sodium chloride flush  3 mL Intravenous Q12H    sodium chloride, alum & mag hydroxide-simeth, lidocaine, magnesium hydroxide, ondansetron (ZOFRAN) IV, sodium chloride flush, sodium chloride flush, traMADol  Prior to Admission medications   Medication Sig Start Date End Date Taking? Authorizing Provider  alendronate (FOSAMAX) 70 MG tablet Take 70 mg by mouth once a week. Takes on Sunday 06/04/13  Yes Historical Provider, MD  aspirin EC 81 MG tablet Take 81 mg by mouth  daily.   Yes Historical Provider, MD  cholecalciferol (VITAMIN D) 1000 UNITS tablet Take 1,000 Units by mouth daily.   Yes Historical Provider, MD  ferrous sulfate 325 (65 FE) MG tablet Take 325 mg by mouth daily with breakfast.   Yes Historical Provider, MD  furosemide (LASIX) 20 MG tablet Take 1 tablet (20 mg total) by mouth daily. Patient taking differently: Take 40 mg by mouth daily.  05/30/16 08/28/16 Yes Wendall StadePeter C Nishan, MD  indapamide (LOZOL) 1.25 MG tablet Take 1.25 mg by mouth daily.   Yes Historical Provider, MD  levothyroxine (SYNTHROID, LEVOTHROID) 50 MCG tablet Take 50 mcg by mouth daily before breakfast.  05/31/13  Yes Historical Provider, MD  losartan (COZAAR) 25 MG tablet Take 1 tablet (25 mg total) by mouth daily. 05/27/16 08/25/16 Yes Wendall StadePeter C Nishan, MD  nitroGLYCERIN (NITROSTAT) 0.4 MG SL tablet Place 0.4 mg under the  tongue every 5 (five) minutes as needed (for difficulty breathing/chest pain.).   Yes Historical Provider, MD  pantoprazole (PROTONIX) 40 MG tablet Take 40 mg by mouth daily.   Yes Historical Provider, MD  potassium chloride (MICRO-K) 10 MEQ CR capsule Take 10 mEq by mouth 2 (two) times daily.  05/31/13  Yes Historical Provider, MD  sodium chloride (OCEAN) 0.65 % SOLN nasal spray Place 1 spray into both nostrils as needed for congestion.   Yes Historical Provider, MD  vitamin C (ASCORBIC ACID) 500 MG tablet Take 500 mg by mouth daily.   Yes Historical Provider, MD     Social History   Social History  . Marital status: Widowed    Spouse name: N/A  . Number of children: N/A  . Years of education: N/A   Occupational History  . Not on file.   Social History Main Topics  . Smoking status: Never Smoker  . Smokeless tobacco: Never Used  . Alcohol use Yes     Comment: occasional  . Drug use: No  . Sexual activity: Not Currently    Birth control/ protection: Post-menopausal   Other Topics Concern  . Not on file   Social History Narrative  . No narrative on  file    Family Status  Relation Status  . Mother Deceased  . Father Deceased  . Sister Deceased  . Brother Deceased  . Daughter Alive  . Son Alive  . Sister Deceased  . Brother Deceased  . Brother Deceased  . Brother Deceased  . Daughter Alive  . Son Alive  . Neg Hx    Family History  Problem Relation Age of Onset  . Heart disease Mother   . Stroke Father   . Heart disease Sister   . Colon cancer Neg Hx       ROS:  Full 14 point review of systems complete and found to be negative unless listed above.  Physical Exam: Blood pressure 137/82, pulse (!) 111, temperature 98 F (36.7 C), temperature source Oral, resp. rate 18, height 5' (1.524 m), weight 132 lb 6.4 oz (60.1 kg), SpO2 100 %.  General: Well developed, well nourished, female in no acute distress Head: Eyes PERRLA, No xanthomas. Normocephalic and atraumatic, oropharynx without edema or exudate.  Lungs: Resp regular and unlabored.  Diminished breath sound on L base. Sternal wound stable.  Heart: RRR no s3, s4, or murmurs..   Neck: No carotid bruits. No lymphadenopathy.  No JVD. Abdomen: Bowel sounds present, abdomen soft and non-tender without masses or hernias noted. Msk:  No spine or cva tenderness. No weakness, no joint deformities or effusions. Extremities: No clubbing, cyanosis or edema. DP/PT/Radials 2+ and equal bilaterally. Neuro: Alert and oriented X 3. No focal deficits noted. Psych:  Good affect, responds appropriately Skin: No rashes or lesions noted.  Labs:   Lab Results  Component Value Date   WBC 5.9 06/26/2016   HGB 10.6 (L) 06/26/2016   HCT 31.8 (L) 06/26/2016   MCV 90.1 06/26/2016   PLT 152 06/26/2016   No results for input(s): INR in the last 72 hours.  Recent Labs Lab 06/25/16 0251  NA 129*  K 4.9  CL 94*  CO2 24  BUN 16  CREATININE 0.65  CALCIUM 9.3  GLUCOSE 99   Magnesium  Date Value Ref Range Status  06/21/2016 2.0 1.7 - 2.4 mg/dL Final   Echo 80/32/12 LV EF: 25% -    30%  ------------------------------------------------------------------- Indications:  Assess LV Function.  ------------------------------------------------------------------- Study Conclusions  - Left ventricle: The cavity size was moderately dilated. Buehl   thickness was increased in a pattern of moderate LVH. Systolic   function was severely reduced. The estimated ejection fraction   was in the range of 25% to 30%. There is akinesis and scarring of   the inferolateral and inferior myocardium. Doppler parameters are   consistent with abnormal left ventricular relaxation (grade 1   diastolic dysfunction). - Aortic valve: Mildly calcified annulus. Trileaflet; moderately   calcified leaflets. Left coronary cusp mobility was restricted.   There was mild regurgitation. - Mitral valve: Calcified annulus. Mildly calcified leaflets .   There was mild regurgitation. - Left atrium: The atrium was moderately dilated. - Right atrium: Central venous pressure (est): 3 mm Hg. - Tricuspid valve: There was trivial regurgitation. - Pulmonary arteries: Systolic pressure could not be accurately   estimated. - Pericardium, extracardiac: There was no pericardial effusion.  Impressions:  - Moderate LV chamber dilatation with moderate LVH and LVEF 25-30%.   There is akinesis and thinning of the inferior/inferolateral Hersch   consistent with ischemic cardiomyopathy. Grade 1 diastolic   dysfunction. Moderate left atrial enlargement. Calcified mitral   annulus with mild mitral regurgitation. Moderately sclerotic   aortic valve with mild aortic regurgitation. Trivial tricuspid   regurgitation.  Left Heart Cath and Coronary Angiography  06/12/16  Conclusion     Ost RCA to Mid RCA lesion, 100 %stenosed. The entire distal RCA system with 2 posterolateral branches and the PDA are filled via collaterals from the LAD septals and diagonal branches  Prox Cx lesion, 100 %stenosed. 2 branches fill  via collaterals from diagonal branch  Mid LAD lesion, 70 %stenosed. 1st Diag lesion, 80 %stenosed.  There is severe left ventricular systolic dysfunction as indicated by echocardiogram and Myoview stress test  LV end diastolic pressure is moderately elevated. 25 mmHg  There is no aortic valve stenosis.   Severe multivessel CAD with chronically occluded circumflex and RCA both filling via collaterals. The LAD itself is a long tubular 70% as well as the diagonal having focal 80%.  Patient's best option is for CT surgical consultation  PLAN: Return to the short stay holding area for TR band removal Discontinue Plavix CT surgery has been consulted. They will try to arrange outpatient consultation either tomorrow or on Monday.    TEE: 06/20/16  Left ventricle: Dilated Thin-walled ventricle. LV systolic function is moderately to severely reduced with an EF of 30-35%. Woo motion is abnormal. Anterior Knupp motion is hypokinetic. Anteroseptal Nesmith motion is hypokinetic. Inferoseptal Vila motion is hypokinetic. Inferior Swallow motion is hypokinetic. Inferolateral Querry motion is hypokinetic. Anterolateral Elk[place]ib motion is hypokinetic.  Septum: Small Patent Foramen Ovale present with left to right shunt visualized by color doppler.  Left atrium: Patent foramen ovale present with left to right shunting indicated by color flow Doppler.  Left atrium: Cavity is dilated and dilated.  Aortic valve: The valve is trileaflet. Moderate valve thickening present. Moderate valve calcification present. Mildly decreased leaflet separation. No stenosis. Mild regurgitation. No AV vegetation.  Mitral valve: Trace regurgitation.  Right ventricle: Normal cavity size and ejection fraction.    Radiology:  Dg Chest 2 View  Result Date: 06/26/2016 CLINICAL DATA:  Status post CABG 6 days ago, persistent shortness of breath and mild chest pain EXAM: CHEST  2 VIEW COMPARISON:  Portable chest x-ray of June 24, 2016. FINDINGS: The right lung is well-expanded and clear. On the  left there is mild lower lobe atelectasis and trace of pleural fluid. The cardiac silhouette is enlarged. The pulmonary vascularity is normal. The sternal wires are intact. The retrosternal soft tissues are normal. The observed portions of the thoracic spine are unremarkable. There is old deformity of the midshaft of the right clavicle. IMPRESSION: Persistent left lower lobe atelectasis and small left pleural effusion. No CHF or pneumothorax. Electronically Signed   By: David  [place] M.D.   On: 06/26/2016 09:08    ASSESSMENT AND PLAN:      1. Post-op Afib with RVR - intermittent in last 48 hours. Rate in 120s when goes into afib. Currently in sinus rhythm. Given amiodarone load 06/22/16. Now on Amiodarone 400mg  BID (today is day 5). Continue Coreg 6.25gm BID.  - CHADSVASCs score of 8. (Age, sex, CHF, HTN, TIA and vascular disease). Plan to start Eliquis 2.5mg  BID (60.1 kg currently, was <60kg on admit 11/10). today however hasn't given dose yet.  - K of 3.0. Correct any electrolyte abnormality. Continue current regimen with anticoagulation initiation.   2. CAD s/p CABG x 4 06/20/16 - Continue ASA and statin. No angina.  3. Left pleural effusion - s/p  thoracentesis 06/24/16. CXR stable today.   4. ICM/chronic systolic CHF - Echocardiogram 16/10/96 showed reduced LV systolic function demonstrating EF of 25-30% with grade 1 diastolic dysfunction. She is euvolemic. Net I & O positive 268cc.  - Continue BB/ACE/ Imdur/lasix.   5. Anemia - Hgb stable and improving.   Dispo: She is planing to move to [place] with her daughter.   SignedManson Passey, PA 06/27/2016, 1:12 PM Pager [phone]  Co-Sign MD

## 2016-06-27 NOTE — Progress Notes (Signed)
CARDIAC REHAB PHASE I   PRE:  Rate/Rhythm: 112 afib,  78 SR lots of PACs  BP:  Supine:   Sitting: 143/9  Standing:    SaO2: 100% 2L  MODE:  Ambulation: 350 ft   POST:  Rate/Rhythm: 78 SR   BP:  Supine:   Sitting: 128/67  Standing:    SaO2: 2L tried several fingers and would not register 1005-1045 Pt upset about events overnight. Encouraged her to talk with management here on unit but she declined at this time. Emotional support given. Tried to discuss CRP 2 [place] with pt but she was too upset so we  can discuss again prior to discharge. Pt walked 350 ft on 2L with rolling walker and asst x 1 with steady gait. Only walked on 2L for comfort as pt going in and out of atrial fib. Did well on RA 11/15. Pt was in NSR at end of walk. In recliner with call bell. Family in room. Pt very motivated to walk.   Luetta Nutting, RN BSN  06/27/2016 10:41 AM

## 2016-06-27 NOTE — Progress Notes (Addendum)
      [address]       [place] [address]             [phone]      7 Days Post-Op Procedure(s) (LRB): CORONARY ARTERY BYPASS GRAFTING (CABG)x4 EVH LEFT GREATER SAPHENOUS VEIN  LIMA-LAD SVG-PD SVG-DIAD L RADIAL-OM (N/A) RADIAL ARTERY HARVEST (Left) TRANSESOPHAGEAL ECHOCARDIOGRAM (TEE) (N/A)   Subjective:  No new complaints.  She states she is feeling a little better today.  She did not sleep much last night, but states she feels she is breathing better.  Objective: Vital signs in last 24 hours: Temp:  [98 F (36.7 C)-98.8 F (37.1 C)] 98 F (36.7 C) (11/17 0459) Pulse Rate:  [70-107] 107 (11/17 0459) Cardiac Rhythm: Atrial fibrillation (11/17 0456) Resp:  [18-20] 18 (11/17 0459) BP: (108-161)/(48-86) 136/85 (11/17 0459) SpO2:  [99 %-100 %] 100 % (11/17 0459) Weight:  [132 lb 6.4 oz (60.1 kg)] 132 lb 6.4 oz (60.1 kg) (11/17 0459)  Intake/Output from previous day: 11/16 0701 - 11/17 0700 In: 0  Out: 950 [Urine:950]  General appearance: alert, cooperative and no distress Heart: regular rate and rhythm Lungs: diminished left base Abdomen: soft, non-tender; bowel sounds normal; no masses,  no organomegaly Extremities: edema trace Wound: clean and dry  Lab Results:  Recent Labs  06/25/16 0251 06/26/16 0500  WBC 9.6 5.9  HGB 10.8* 10.6*  HCT 32.9* 31.8*  PLT 201 152   BMET:  Recent Labs  06/25/16 0251  NA 129*  K 4.9  CL 94*  CO2 24  GLUCOSE 99  BUN 16  CREATININE 0.65  CALCIUM 9.3    PT/INR: No results for input(s): LABPROT, INR in the last 72 hours. ABG    Component Value Date/Time   PHART 7.371 06/20/2016 2043   HCO3 29.4 (H) 06/20/2016 2043   TCO2 26 06/21/2016 1709   O2SAT 74.6 06/22/2016 0350   CBG (last 3)   Recent Labs  06/26/16 1130 06/26/16 1619 06/26/16 2115  GLUCAP 116* 70 92    Assessment/Plan: S/P Procedure(s) (LRB): CORONARY ARTERY BYPASS GRAFTING (CABG)x4 EVH LEFT GREATER SAPHENOUS VEIN  LIMA-LAD  SVG-PD SVG-DIAD L RADIAL-OM (N/A) RADIAL ARTERY HARVEST (Left) TRANSESOPHAGEAL ECHOCARDIOGRAM (TEE) (N/A)  1. CV- PAF overnight, Currently NSR-continue Coreg, but will increase dose to 6.25 mg BID, continue Imdur, Amiodarone, and Cozaar... Cardiology consult requested, however has not yet evaluated patient 2. Pulm- wean oxygen as tolerated, good use of IS 3. Renal- creatinine has been WNL, weight is trending down, continue Lasix 4. Dispo- patient again with A. Fib overnight, awaiting Cardiology recommendations, increased Coreg for better HR control, may ultimately need anticoagulation, continue to wean oxygen, good use of IS...continue current care   LOS: 7 days    [name], Cheryl 06/27/2016 Patient seen and examined. Had some "aggravations" last night but feels better today Back in atrial fib- will start Eliquis  Cheryl [name] C. Dorris Fetch, MD [facility] [phone]

## 2016-06-28 LAB — CBC
HEMATOCRIT: 33.8 % — AB (ref 36.0–46.0)
Hemoglobin: 11.1 g/dL — ABNORMAL LOW (ref 12.0–15.0)
MCH: 29.6 pg (ref 26.0–34.0)
MCHC: 32.8 g/dL (ref 30.0–36.0)
MCV: 90.1 fL (ref 78.0–100.0)
Platelets: 283 10*3/uL (ref 150–400)
RBC: 3.75 MIL/uL — ABNORMAL LOW (ref 3.87–5.11)
RDW: 14.5 % (ref 11.5–15.5)
WBC: 5.8 10*3/uL (ref 4.0–10.5)

## 2016-06-28 LAB — MAGNESIUM: Magnesium: 1.7 mg/dL (ref 1.7–2.4)

## 2016-06-28 MED ORDER — BLISTEX MEDICATED EX OINT
TOPICAL_OINTMENT | CUTANEOUS | Status: DC | PRN
  Administered 2016-06-28: 1 via TOPICAL
  Filled 2016-06-28: qty 6.3

## 2016-06-28 MED ORDER — POTASSIUM CHLORIDE CRYS ER 20 MEQ PO TBCR
40.0000 meq | EXTENDED_RELEASE_TABLET | Freq: Every day | ORAL | Status: DC
  Administered 2016-06-28 – 2016-06-30 (×3): 40 meq via ORAL
  Filled 2016-06-28 (×3): qty 2

## 2016-06-28 NOTE — Progress Notes (Signed)
patient ambulated 350 ft using walker while on O2 1L and assistance X1.Well tolerated, patient now back in the room, sitting on the recliner, call bell within reach will continue to monitor

## 2016-06-28 NOTE — Progress Notes (Signed)
patient ambulated 350 ft using walker while on O2 1L and assistance X1.Well tolerated, patient now back in the room, sitting on the recliner, call bell within reach will continue to monitr

## 2016-06-28 NOTE — Progress Notes (Signed)
CARDIAC REHAB PHASE I   PRE:  Rate/Rhythm: 60 SR  BP:  Supine:   Sitting: 110/64  Standing:    SaO2: 96 2 L  MODE:  Ambulation: 330 ft   POST:  Rate/Rhythm: 64 SR  BP:  Supine:   Sitting: 122/66  Standing:    SaO2: 97 2L decreased oxygen to 1 L will report to nurse.  IS encouraged, poor demonstration by patient, reinstructed.  Tolerated ambulation well with rolling walker and assistance x 1.  Family helping prompt her IS usage.    [id number]  Cindra Eves RN, BSN 06/28/2016 11:52 AM

## 2016-06-28 NOTE — Progress Notes (Addendum)
      [address]       [place] [address]             [phone]      8 Days Post-Op Procedure(s) (LRB): CORONARY ARTERY BYPASS GRAFTING (CABG)x4 EVH LEFT GREATER SAPHENOUS VEIN  LIMA-LAD SVG-PD SVG-DIAD L RADIAL-OM (N/A) RADIAL ARTERY HARVEST (Left) TRANSESOPHAGEAL ECHOCARDIOGRAM (TEE) (N/A)   Subjective:  Complains of being tired this morning.  Otherwise thinks she is slowly doing better.  Objective: Vital signs in last 24 hours: Temp:  [98.1 F (36.7 C)-98.8 F (37.1 C)] 98.1 F (36.7 C) (11/18 0517) Pulse Rate:  [64-111] 72 (11/18 0517) Cardiac Rhythm: Normal sinus rhythm (11/17 1900) Resp:  [18-20] 18 (11/18 0517) BP: (92-137)/(54-88) 124/88 (11/18 0517) SpO2:  [94 %-100 %] 100 % (11/18 0517) Weight:  [131 lb 6.3 oz (59.6 kg)] 131 lb 6.3 oz (59.6 kg) (11/18 0517)  Intake/Output from previous day: 11/17 0701 - 11/18 0700 In: 740 [P.O.:720; I.V.:20] Out: 800 [Urine:800]  General appearance: alert, cooperative and no distress Heart: regular rate and rhythm Lungs: diminished breath sounds bibasilar Abdomen: soft, non-tender; bowel sounds normal; no masses,  no organomegaly Extremities: edema none appreciated Wound: clean and dry  Lab Results:  Recent Labs  06/26/16 0500 06/28/16 0346  WBC 5.9 5.8  HGB 10.6* 11.1*  HCT 31.8* 33.8*  PLT 152 283   BMET:  Recent Labs  06/27/16 1146  NA 129*  K 3.0*  CL 90*  CO2 28  GLUCOSE 101*  BUN 9  CREATININE 0.58  CALCIUM 8.4*    PT/INR: No results for input(s): LABPROT, INR in the last 72 hours. ABG    Component Value Date/Time   PHART 7.371 06/20/2016 2043   HCO3 29.4 (H) 06/20/2016 2043   TCO2 26 06/21/2016 1709   O2SAT 74.6 06/22/2016 0350   CBG (last 3)   Recent Labs  06/26/16 1130 06/26/16 1619 06/26/16 2115  GLUCAP 116* 70 92    Assessment/Plan: S/P Procedure(s) (LRB): CORONARY ARTERY BYPASS GRAFTING (CABG)x4 EVH LEFT GREATER SAPHENOUS VEIN  LIMA-LAD SVG-PD SVG-DIAD L  RADIAL-OM (N/A) RADIAL ARTERY HARVEST (Left) TRANSESOPHAGEAL ECHOCARDIOGRAM (TEE) (N/A)  1. CV- PAF, currently Sinus [place]oley- will continue Coreg, Amiodarone, Lozol, and Cozaar- continue Eliquis- appreciate Cardiology assistance 2. Pulm- wean oxygen as tolerated, good use of IS 3. Renal- creatinine has been WNL, + Hypokalemia, on supplementation, will repeat BMET in AM 4. Dispo- patient with some PAF overnight, currently in NSR, continue current cardiac meds, wean oxygen, supplement K.. Will repeat BMET In AM   LOS: 8 days    [name], [name] 06/28/2016  I have seen and examined the patient and agree with the assessment and plan as outlined.  Purcell Nails, MD 06/28/2016 10:48 AM

## 2016-06-29 LAB — BASIC METABOLIC PANEL
ANION GAP: 12 (ref 5–15)
BUN: 8 mg/dL (ref 6–20)
CALCIUM: 8.7 mg/dL — AB (ref 8.9–10.3)
CO2: 28 mmol/L (ref 22–32)
Chloride: 89 mmol/L — ABNORMAL LOW (ref 101–111)
Creatinine, Ser: 0.66 mg/dL (ref 0.44–1.00)
GFR calc Af Amer: >60 mL/min (ref >60)
GFR calc non Af Amer: >60 mL/min (ref >60)
GLUCOSE: 96 mg/dL (ref 65–99)
POTASSIUM: 3.4 mmol/L — AB (ref 3.5–5.1)
Sodium: 129 mmol/L — ABNORMAL LOW (ref 135–145)

## 2016-06-29 NOTE — Progress Notes (Addendum)
      [address]       [place] [address]             [phone]      9 Days Post-Op Procedure(s) (LRB): CORONARY ARTERY BYPASS GRAFTING (CABG)x4 EVH LEFT GREATER SAPHENOUS VEIN  LIMA-LAD SVG-PD SVG-DIAD L RADIAL-OM (N/A) RADIAL ARTERY HARVEST (Left) TRANSESOPHAGEAL ECHOCARDIOGRAM (TEE) (N/A)   Subjective:  Cheryl [name] has no new complaints.  She states she really notices a difference of her getting stronger over the past 3 days.  She is off oxygen this morning.  Objective: Vital signs in last 24 hours: Temp:  [97.7 F (36.5 C)-98.6 F (37 C)] 97.7 F (36.5 C) (11/19 0814) Pulse Rate:  [58-66] 65 (11/19 0252) Cardiac Rhythm: Normal sinus rhythm (11/18 1900) Resp:  [18-20] 18 (11/19 0814) BP: (119-162)/(66-81) 162/79 (11/19 0814) SpO2:  [96 %-100 %] 100 % (11/19 0252) Weight:  [130 lb 9.6 oz (59.2 kg)] 130 lb 9.6 oz (59.2 kg) (11/19 0252)  Intake/Output from previous day: 11/18 0701 - 11/19 0700 In: 600 [P.O.:600] Out: 850 [Urine:850]  General appearance: alert, cooperative and no distress Heart: regular rate and rhythm Lungs: clear to auscultation bilaterally Abdomen: soft, non-tender; bowel sounds normal; no masses,  no organomegaly Extremities: edema trace Wound: clean and dry  Lab Results:  Recent Labs  06/28/16 0346  WBC 5.8  HGB 11.1*  HCT 33.8*  PLT 283   BMET:  Recent Labs  06/27/16 1146 06/29/16 0453  NA 129* 129*  K 3.0* 3.4*  CL 90* 89*  CO2 28 28  GLUCOSE 101* 96  BUN 9 8  CREATININE 0.58 0.66  CALCIUM 8.4* 8.7*    PT/INR: No results for input(s): LABPROT, INR in the last 72 hours. ABG    Component Value Date/Time   PHART 7.371 06/20/2016 2043   HCO3 29.4 (H) 06/20/2016 2043   TCO2 26 06/21/2016 1709   O2SAT 74.6 06/22/2016 0350   CBG (last 3)   Recent Labs  06/26/16 1130 06/26/16 1619 06/26/16 2115  GLUCAP 116* 70 92    Assessment/Plan: S/P Procedure(s) (LRB): CORONARY ARTERY BYPASS GRAFTING (CABG)x4  EVH LEFT GREATER SAPHENOUS VEIN  LIMA-LAD SVG-PD SVG-DIAD L RADIAL-OM (N/A) RADIAL ARTERY HARVEST (Left) TRANSESOPHAGEAL ECHOCARDIOGRAM (TEE) (N/A)  1. CV- PAF, none for past 24 hours, currently NSR- continue Amiodarone, Lopressor, Cozaar, Eliquis 2. Pulm- off oxygen, continue IS 3. Renal- creatinine has been WNL, Hypokalemia improving up to 3.4 continue supplementation 4. Deconditioning- patient ambulating without much difficulty, will arrange rolling walker for d/c 5. Dispo- patient stable, making good progress over past several days, will monitor today, supplement potassium, possibly ready for d/c home in AM   LOS: 9 days    [name], [name] 06/29/2016   I have seen and examined the patient and agree with the assessment and plan as outlined.  Anticipate likely d/c home tomorrow.  Purcell Nails, MD 06/29/2016 10:44 AM

## 2016-06-30 MED ORDER — TRAMADOL HCL 50 MG PO TABS: 50.0000 mg | ORAL_TABLET | Freq: Four times a day (QID) | ORAL | 0 refills | Status: DC | PRN

## 2016-06-30 MED ORDER — SIMVASTATIN 5 MG PO TABS: 5.0000 mg | ORAL_TABLET | Freq: Every day | ORAL | 1 refills | Status: DC

## 2016-06-30 MED ORDER — ISOSORBIDE MONONITRATE ER 30 MG PO TB24: 15.0000 mg | ORAL_TABLET | Freq: Every day | ORAL | 0 refills | Status: DC

## 2016-06-30 MED ORDER — CARVEDILOL 6.25 MG PO TABS: 6.2500 mg | ORAL_TABLET | Freq: Two times a day (BID) | ORAL | 1 refills | Status: DC

## 2016-06-30 MED ORDER — APIXABAN 2.5 MG PO TABS: 2.5000 mg | ORAL_TABLET | Freq: Two times a day (BID) | ORAL | 1 refills | Status: DC

## 2016-06-30 MED ORDER — AMIODARONE HCL 200 MG PO TABS: 200.0000 mg | ORAL_TABLET | Freq: Two times a day (BID) | ORAL | 1 refills | Status: DC

## 2016-06-30 NOTE — Progress Notes (Signed)
Ed completed with pt. Voiced understanding. Set up d/c video. Will f/u later to discuss CRPII with daughter. Pt wants to do program in [place]. [phone] Ethelda Chick CES, ACSM 9:24 AM  06/30/2016

## 2016-06-30 NOTE — Care Management Note (Signed)
Case Management Note Previous CM note initiated by [name], RN 06/23/2016, 9:59 AM   Patient Details  Name: Cheryl [name] MRN: [id number] Date of Birth: [birth year]  Subjective/Objective:      S/p CABG              Action/Plan:  PTA independent from home alone.  Pts daughter will stay with pt for at least a week and provide 24/7 supervision as recommended.  Post that time period ; pt has already made arrangements to temporarily move in with daughter in [place] if more supervision is needed.  CM will continue to follow for discharge needs   Expected Discharge Date:    06/30/16              Expected Discharge Plan:  Home/Self Care  In-House Referral:     Discharge planning Services  CM Consult  Post Acute Care Choice:  Durable Medical Equipment Choice offered to:  Patient  DME Arranged:  Dan Humphreys rolling DME Agency:  [facility]  HH Arranged:    [facility] Agency:     Status of Service:  Completed, signed off  If discussed at Long Length of Stay Meetings, dates discussed:    Additional Comments:  06/30/16- 1120- Cheryl Gibbons RN, CM- pt for d/c home today- order placed for RW- notified Shannon with [facility] for DME need- RW to be delivered to room prior to discharge.   Cheryl Alpers [place], RN 06/30/2016, 11:18 AM [phone]

## 2016-06-30 NOTE — Progress Notes (Signed)
Pt. Son came at 1230 and was waiting on [facility] for rolling walker. Son became inpatient for waiting, and said she does not need a walker she has a Museum/gallery exhibitions officer at her daughters house. I made CM- Kristi aware who has paged [facility].  Pt. Discharged Pt. D/C'd via wheelchair with Discharge information reviewed and given with teach back All personal belongings given to Pt.  Education discussed with pt. And son IV was d/c Tele d/c

## 2016-06-30 NOTE — Progress Notes (Addendum)
[address]       [place] [id number]             [phone]      10 Days Post-Op Procedure(s) (LRB): CORONARY ARTERY BYPASS GRAFTING (CABG)x4 EVH LEFT GREATER SAPHENOUS VEIN  LIMA-LAD SVG-PD SVG-DIAD L RADIAL-OM (N/A) RADIAL ARTERY HARVEST (Left) TRANSESOPHAGEAL ECHOCARDIOGRAM (TEE) (N/A) Subjective: Looks and feels well  Objective: Vital signs in last 24 hours: Temp:  [97.7 F (36.5 C)-98.4 F (36.9 C)] 97.7 F (36.5 C) (11/20 0528) Pulse Rate:  [58-68] 62 (11/20 0528) Cardiac Rhythm: Normal sinus rhythm (11/19 1900) Resp:  [18-20] 18 (11/20 0528) BP: (114-162)/(55-84) 133/76 (11/20 0528) SpO2:  [95 %-97 %] 97 % (11/20 0528) Weight:  [129 lb 3.2 oz (58.6 kg)] 129 lb 3.2 oz (58.6 kg) (11/20 0528)  Hemodynamic parameters for last 24 hours:    Intake/Output from previous day: 11/19 0701 - 11/20 0700 In: 360 [P.O.:360] Out: 1350 [Urine:1350] Intake/Output this shift: No intake/output data recorded.  General appearance: alert, cooperative and no distress Heart: regular rate and rhythm Lungs: dim in bases Abdomen: benign Extremities: no edema Wound: incis healing well l hand - neuro intact Lab Results:  Recent Labs  06/28/16 0346  WBC 5.8  HGB 11.1*  HCT 33.8*  PLT 283   BMET:  Recent Labs  06/27/16 1146 06/29/16 0453  NA 129* 129*  K 3.0* 3.4*  CL 90* 89*  CO2 28 28  GLUCOSE 101* 96  BUN 9 8  CREATININE 0.58 0.66  CALCIUM 8.4* 8.7*    PT/INR: No results for input(s): LABPROT, INR in the last 72 hours. ABG    Component Value Date/Time   PHART 7.371 06/20/2016 2043   HCO3 29.4 (H) 06/20/2016 2043   TCO2 26 06/21/2016 1709   O2SAT 74.6 06/22/2016 0350   CBG (last 3)  No results for input(s): GLUCAP in the last 72 hours.  Meds Scheduled Meds: . amiodarone  400 mg Oral BID  . apixaban  2.5 mg Oral BID  . aspirin EC  81 mg Oral Daily  . bisacodyl  10 mg Oral Daily   Or  . bisacodyl  10 mg Rectal Daily  .  carvedilol  6.25 mg Oral BID WC  . docusate sodium  200 mg Oral Daily  . furosemide  40 mg Oral Daily  . indapamide  1.25 mg Oral Daily  . isosorbide mononitrate  15 mg Oral Daily  . levalbuterol  0.63 mg Nebulization Once  . levothyroxine  50 mcg Oral QAC breakfast  . losartan  25 mg Oral Daily  . mouth rinse  15 mL Mouth Rinse BID  . moving right along book   Does not apply Once  . pantoprazole  40 mg Oral Daily  . potassium chloride  40 mEq Oral Daily  . simvastatin  5 mg Oral q1800  . sodium chloride flush  3 mL Intravenous Q12H   Continuous Infusions: PRN Meds:.sodium chloride, alum & mag hydroxide-simeth, guaiFENesin-dextromethorphan, lidocaine, lip balm, magnesium hydroxide, ondansetron (ZOFRAN) IV, sodium chloride flush, sodium chloride flush, traMADol  Xrays No results found.  Assessment/Plan: S/P Procedure(s) (LRB): CORONARY ARTERY BYPASS GRAFTING (CABG)x4 EVH LEFT GREATER SAPHENOUS VEIN  LIMA-LAD SVG-PD SVG-DIAD L RADIAL-OM (N/A) RADIAL ARTERY HARVEST (Left) TRANSESOPHAGEAL ECHOCARDIOGRAM (TEE) (N/A)  1 doing well , appears to be quite stable for discharge 2 BP is pretty variable- cont current meds, may need adjustment as outpatient 3 QTc 480- decrease amio to 200 bid  LOS: 10 days    GOLD,WAYNE E 06/30/2016 Patient seen and examined, agree with above Home today  Viviann Spare C. Dorris Fetch, MD [facility] [phone]

## 2016-07-17 ENCOUNTER — Ambulatory Visit (INDEPENDENT_AMBULATORY_CARE_PROVIDER_SITE_OTHER): Payer: Medicare Other | Admitting: Adult Health

## 2016-07-17 ENCOUNTER — Other Ambulatory Visit: Payer: Self-pay | Admitting: Adult Health

## 2016-07-17 ENCOUNTER — Encounter: Payer: Self-pay | Admitting: Adult Health

## 2016-07-17 VITALS — BP 124/62 | HR 55 | Ht 60.0 in | Wt 122.0 lb

## 2016-07-17 DIAGNOSIS — I255 Ischemic cardiomyopathy: Secondary | ICD-10-CM | POA: Diagnosis not present

## 2016-07-17 DIAGNOSIS — I4892 Unspecified atrial flutter: Secondary | ICD-10-CM

## 2016-07-17 DIAGNOSIS — I2510 Atherosclerotic heart disease of native coronary artery without angina pectoris: Secondary | ICD-10-CM

## 2016-07-17 DIAGNOSIS — E7800 Pure hypercholesterolemia, unspecified: Secondary | ICD-10-CM

## 2016-07-17 DIAGNOSIS — E876 Hypokalemia: Secondary | ICD-10-CM

## 2016-07-17 DIAGNOSIS — I2589 Other forms of chronic ischemic heart disease: Secondary | ICD-10-CM

## 2016-07-17 MED ORDER — SIMVASTATIN 5 MG PO TABS: 5.0000 mg | ORAL_TABLET | Freq: Every day | ORAL | 1 refills | Status: DC

## 2016-07-17 MED ORDER — LOSARTAN POTASSIUM 25 MG PO TABS: 25.0000 mg | ORAL_TABLET | Freq: Every day | ORAL | 3 refills | Status: DC

## 2016-07-17 MED ORDER — AMIODARONE HCL 200 MG PO TABS: 200.0000 mg | ORAL_TABLET | Freq: Two times a day (BID) | ORAL | 1 refills | Status: DC

## 2016-07-17 MED ORDER — CARVEDILOL 6.25 MG PO TABS: 6.2500 mg | ORAL_TABLET | Freq: Two times a day (BID) | ORAL | 1 refills | Status: DC

## 2016-07-17 MED ORDER — POTASSIUM CHLORIDE ER 10 MEQ PO CPCR: 10.0000 meq | ORAL_CAPSULE | Freq: Two times a day (BID) | ORAL | 1 refills | Status: DC

## 2016-07-17 MED ORDER — ISOSORBIDE MONONITRATE ER 30 MG PO TB24
15.0000 mg | ORAL_TABLET | Freq: Every day | ORAL | 1 refills | Status: DC
Start: 2016-07-17 — End: 2016-07-22

## 2016-07-17 MED ORDER — APIXABAN 2.5 MG PO TABS: 2.5000 mg | ORAL_TABLET | Freq: Two times a day (BID) | ORAL | 1 refills | Status: DC

## 2016-07-17 MED ORDER — AMIODARONE HCL 200 MG PO TABS: 200.0000 mg | ORAL_TABLET | Freq: Every day | ORAL | 1 refills | Status: DC

## 2016-07-17 MED ORDER — FUROSEMIDE 20 MG PO TABS: 20.0000 mg | ORAL_TABLET | Freq: Every day | ORAL | 1 refills | Status: DC

## 2016-07-17 MED ORDER — INDAPAMIDE 1.25 MG PO TABS: 1.2500 mg | ORAL_TABLET | Freq: Every day | ORAL | 1 refills | Status: DC

## 2016-07-17 NOTE — Progress Notes (Signed)
Cardiology Office Note   Date:  07/17/2016   ID:  Cheryl [name], DOB 1933-07-24, MRN [id number]  PCP:  Fredirick Maudlin, MD  Cardiologist: Elzie Rings, NP   Chief Complaint  Patient presents with  . Coronary Artery Disease    S/P CABG  . Hypertension     History of Present Illness: Cheryl [name] is a 82 y.o. female who presents for post hospital discharge after cardiac cath was completed for chest pain. She has a history of hypertension, hypercholesterolemia, and TIA.   She underwent cardiac catheterization 06/12/16 by Dr. Herbie Baltimore that showed severe 3-vessel CAD with chronically occluded circumflex and RCA both filling via collaterals. The LAD itself is a long tubular 70% as well as the diagonal having focal 80%. Subsequently underwent CABG x 4 (LIMA-LAD, SVG-PD, SVG-1st DIAG, L RADIAL-OM 1 ) on 06/20/16. She had post operative atrial fib. An echocardiogram showed severe left ventricular dysfunction with ejection fraction of 25%. She is also started on Eliquis. She was in NSR on discharge.   Today she is here with her daughter who lives in [place]. She is planning on moving to [facility] on 07/30/2016. She is already been established with a cardiologist Jomarie Longs. Irving Shows, he is located at [address], [place], [address]. Phone number [phone]. Fax [phone].   She comes today without any complaints other than generalized fatigue. She is due to follow-up with Dr. Dorris Fetch next week. She has not started cardiac rehabilitation as she will be moving in 2 weeks. She is medically compliant. She is anxious to begin driving again.  Past Medical History:  Diagnosis Date  . Anginal pain (HCC)   . Anxiety   . Bulging discs   . COPD (chronic obstructive pulmonary disease) (HCC)   . Coronary artery disease   . Degenerative disc disease   . Diverticulitis    complicated, with microperforation, sigmoid stricture  . Dyspnea    more at  night  . GERD (gastroesophageal reflux disease)   . Hypertension   . Hypothyroidism   . Myocardial infarction   . Stroke [facility])    tia's    Past Surgical History:  Procedure Laterality Date  . APPENDECTOMY    . CARDIAC CATHETERIZATION N/A 06/12/2016   Procedure: Left Heart Cath and Coronary Angiography;  Surgeon: Marykay Lex, MD;  Location: [facility] INVASIVE CV LAB;  Service: Cardiovascular;  Laterality: N/A;  . COCCYX REMOVAL    . COLONOSCOPY  June 2014   Dr. Aliene Beams in [place]: via colostomy and rectum. scattered diverticula. no obvious polyps. Hemorrhoids noted in anal canal. Exam limited due to poor prep  . COLOSTOMY  Dec 13, 2012   Dec 13, 2012 placed. Reversed   . CORONARY ARTERY BYPASS GRAFT N/A 06/20/2016   Procedure: CORONARY ARTERY BYPASS GRAFTING (CABG)x4 EVH LEFT GREATER SAPHENOUS VEIN  LIMA-LAD SVG-PD SVG-DIAD L RADIAL-OM;  Surgeon: Loreli Slot, MD;  Location: [facility] OR;  Service: Open Heart Surgery;  Laterality: N/A;  . ESOPHAGOGASTRODUODENOSCOPY N/A 07/01/2013   Procedure: ESOPHAGOGASTRODUODENOSCOPY (EGD);  Surgeon: West Bali, MD;  Location: [facility] ENDO SUITE;  Service: Endoscopy;  Laterality: N/A;  3:00PM  . FLEXIBLE SIGMOIDOSCOPY  December 08, 2012   Dr. Kerby Less in [place]: very edematous sigmoid colon, unable to advance scope past 25-30 cm despite attempt with peds scope.   . IR GENERIC HISTORICAL  06/24/2016   IR THORACENTESIS ASP PLEURAL SPACE W/IMG GUIDE 06/24/2016 Brayton El, PA-C [facility]-INTERV RAD  .  KNEE ARTHROSCOPY Right   . LAPAROSCOPIC LOW ANTERIOR RESECTION  summer 2014   with reversal of colostomy  . LAPAROSCOPY  May 2014  . RADIAL ARTERY HARVEST Left 06/20/2016   Procedure: RADIAL ARTERY HARVEST;  Surgeon: Loreli SlotSteven C Hendrickson, MD;  [facility];  Service: Open Heart Surgery;  Laterality: Left;  . TEE WITHOUT CARDIOVERSION N/A 06/20/2016   Procedure: TRANSESOPHAGEAL ECHOCARDIOGRAM (TEE);  Surgeon: Loreli SlotSteven C Hendrickson, MD;  [facility];   Service: Open Heart Surgery;  Laterality: N/A;     Current Outpatient Prescriptions  Medication Sig Dispense Refill  . alendronate (FOSAMAX) 70 MG tablet Take 70 mg by mouth once a week. Takes on Sunday    . amiodarone (PACERONE) 200 MG tablet Take 1 tablet (200 mg total) by mouth 2 (two) times daily. 60 tablet 1  . apixaban (ELIQUIS) 2.5 MG TABS tablet Take 1 tablet (2.5 mg total) by mouth 2 (two) times daily. 60 tablet 1  . aspirin EC 81 MG tablet Take 81 mg by mouth daily.    . carvedilol (COREG) 6.25 MG tablet Take 1 tablet (6.25 mg total) by mouth 2 (two) times daily with a meal. 60 tablet 1  . cholecalciferol (VITAMIN D) 1000 UNITS tablet Take 1,000 Units by mouth daily.    . ferrous sulfate 325 (65 FE) MG tablet Take 325 mg by mouth daily with breakfast.    . indapamide (LOZOL) 1.25 MG tablet Take 1.25 mg by mouth daily.    . isosorbide mononitrate (IMDUR) 30 MG 24 hr tablet Take 0.5 tablets (15 mg total) by mouth daily. 15 tablet 0  . levothyroxine (SYNTHROID, LEVOTHROID) 50 MCG tablet Take 50 mcg by mouth daily before breakfast.     . losartan (COZAAR) 25 MG tablet Take 1 tablet (25 mg total) by mouth daily. 90 tablet 3  . pantoprazole (PROTONIX) 40 MG tablet Take 40 mg by mouth daily.    . potassium chloride (MICRO-K) 10 MEQ CR capsule Take 10 mEq by mouth 2 (two) times daily.     . simvastatin (ZOCOR) 5 MG tablet Take 1 tablet (5 mg total) by mouth daily at 6 PM. 30 tablet 1  . traMADol (ULTRAM) 50 MG tablet Take 1 tablet (50 mg total) by mouth every 6 (six) hours as needed for moderate pain. 28 tablet 0  . vitamin C (ASCORBIC ACID) 500 MG tablet Take 500 mg by mouth daily.     No current facility-administered medications for this visit.     Allergies:   Penicillins    Social History:  The patient  reports that she has never smoked. She has never used smokeless tobacco. She reports that she drinks alcohol. She reports that she does not use drugs.   Family History:  The  patient's family history includes Heart disease in her mother and sister; Stroke in her father.    ROS: All other systems are reviewed and negative. Unless otherwise mentioned in H&P    PHYSICAL EXAM: VS:  BP 124/62   Pulse (!) 55   Ht 5' (1.524 m)   Wt 122 lb (55.3 kg)   SpO2 99%   BMI 23.83 kg/m  , BMI Body mass index is 23.83 kg/m. GEN: Well nourished, well developed, in no acute distress  HEENT: normal  Neck: no JVD, carotid bruits, or masses Cardiac: RRR; no murmurs, rubs, or gallops,no edema  Respiratory:  clear to auscultation bilaterally, normal work of breathing GI: soft, nontender, nondistended, + BS MS: no deformity  or atrophy well healed sternotomy incision, no evidence of bleeding infection or evisceration. Left radial incision healing well.. Left leg incision proximal to the left knee, some ecchymosis but no evidence of bleeding or infection Skin: warm and dry, no rash Neuro:  Strength and sensation are intact Psych: euthymic mood, full affect   EKG:  The ekg ordered today demonstrates sinus bradycardia, rate of 55 bpm, diffuse nonspecific T-wave abnormalities with old inferior infarct noted  Recent Labs: 05/27/2016: Brain Natriuretic Peptide 1,545.3 06/19/2016: ALT 16 06/28/2016: Hemoglobin 11.1; Magnesium 1.7; Platelets 283 06/29/2016: BUN 8; Creatinine, Ser 0.66; Potassium 3.4; Sodium 129    Lipid Panel No results found for: CHOL, TRIG, HDL, CHOLHDL, VLDL, LDLCALC, LDLDIRECT    Wt Readings from Last 3 Encounters:  07/17/16 122 lb (55.3 kg)  06/30/16 129 lb 3.2 oz (58.6 kg)  06/19/16 127 lb 11.2 oz (57.9 kg)    ASSESSMENT AND PLAN:  1. Coronary artery disease: Status post coronary artery bypass grafting, (LIMA to LAD, left radial artery to OM1, SVG to first diagonal, SVG to posterior descending) endoscopic vein harvest left thigh. She is fatigued but moving forward. She is anxious to get back to doing more activities, she wants to drive, she has been  walking in her home. She has not been able to undertake cardiac rehabilitation yet. She will be moving to [place] on December 20 and be established with cardiologist listed above. Once established with that cardiologist he may get her into cardiac rehabilitation at their facility. She is due to follow-up with surgeon on December 12th, 2017. He will let her know whether she can drive. I have allowed her to shower as long she sits in a shower chair to avoid fatigue.  We'll continue current medication regimen. 90 day prescriptions are provided for her to give her enough medications to last her until she is established with cardiologist in [place].  2. Postoperative atrial fibrillation: She remains on amiodarone 200 mg BID. She will need to decrease this to 200 mg daily, she will continue ELIQUIS 2.5 mg twice a day. EKG reveals sinus bradycardia. No evidence of bleeding or severe ecchymosis.  3. Ischemic cardiomyopathy: EF of 25%. Continue carvedilol, ARB, Lasix. BMET is ordered. She will need a follow-up echocardiogram in 3 months.  4. Hypercholesterolemia: Continue statin therapy.   Current medicines are reviewed at length with the patient today.    Labs/ tests ordered today include: BMET  No orders of the defined types were placed in this encounter.    Disposition:   FU with when necessary (patient is moving to [place], and is established with cardiologist there. Happy to see her if she returns to [facility]). Records are being faxed to cardiologist as listed above, along with a hard copy being sent to the patient for her own records in case the fax copies are not available on her follow-up appointment.  Signed, Joni Reining, NP  07/17/2016 2:03 PM    [facility] [address], [place], [place] [address] Phone: [phone]; Fax: [phone]

## 2016-07-17 NOTE — Patient Instructions (Signed)
I refilled your cardiac medications ,90 day supply with 1 refill  Sign release of information form at front desk to have records sent to Dr Melvyn Novas and to your daughter    Get lab work today :BMET      Thank you for choosing [facility] !

## 2016-07-17 NOTE — Progress Notes (Signed)
Name: Cheryl [name]    DOB: 06-16-1933  Age: 82 y.o.  MR#: [id number]       PCP:  Fredirick Maudlin, MD      Insurance: Payor: MEDICARE / Plan: MEDICARE PART A AND B / Product Type: *No Product type* /   CC:    Chief Complaint  Patient presents with  . Coronary Artery Disease    S/P CABG  . Hypertension    VS Vitals:   07/17/16 1400  BP: 124/62  Pulse: (!) 55  SpO2: 99%  Weight: 122 lb (55.3 kg)  Height: 5' (1.524 m)    Weights Current Weight  07/17/16 122 lb (55.3 kg)  06/30/16 129 lb 3.2 oz (58.6 kg)  06/19/16 127 lb 11.2 oz (57.9 kg)    Blood Pressure  BP Readings from Last 3 Encounters:  07/17/16 124/62  06/30/16 136/71  06/19/16 140/68     Admit date:  (Not on file) Last encounter with RMR:  06/09/2016   Allergy Penicillins  Current Outpatient Prescriptions  Medication Sig Dispense Refill  . alendronate (FOSAMAX) 70 MG tablet Take 70 mg by mouth once a week. Takes on Sunday    . amiodarone (PACERONE) 200 MG tablet Take 1 tablet (200 mg total) by mouth 2 (two) times daily. 60 tablet 1  . apixaban (ELIQUIS) 2.5 MG TABS tablet Take 1 tablet (2.5 mg total) by mouth 2 (two) times daily. 60 tablet 1  . aspirin EC 81 MG tablet Take 81 mg by mouth daily.    . carvedilol (COREG) 6.25 MG tablet Take 1 tablet (6.25 mg total) by mouth 2 (two) times daily with a meal. 60 tablet 1  . cholecalciferol (VITAMIN D) 1000 UNITS tablet Take 1,000 Units by mouth daily.    . ferrous sulfate 325 (65 FE) MG tablet Take 325 mg by mouth daily with breakfast.    . furosemide (LASIX) 20 MG tablet Take 20 mg by mouth daily.    . indapamide (LOZOL) 1.25 MG tablet Take 1.25 mg by mouth daily.    . isosorbide mononitrate (IMDUR) 30 MG 24 hr tablet Take 0.5 tablets (15 mg total) by mouth daily. 15 tablet 0  . levothyroxine (SYNTHROID, LEVOTHROID) 50 MCG tablet Take 50 mcg by mouth daily before breakfast.     . losartan (COZAAR) 25 MG tablet Take 1 tablet (25 mg total) by mouth daily. 90 tablet  3  . pantoprazole (PROTONIX) 40 MG tablet Take 40 mg by mouth daily.    . potassium chloride (MICRO-K) 10 MEQ CR capsule Take 10 mEq by mouth 2 (two) times daily.     . simvastatin (ZOCOR) 5 MG tablet Take 1 tablet (5 mg total) by mouth daily at 6 PM. 30 tablet 1  . traMADol (ULTRAM) 50 MG tablet Take 1 tablet (50 mg total) by mouth every 6 (six) hours as needed for moderate pain. 28 tablet 0  . vitamin C (ASCORBIC ACID) 500 MG tablet Take 500 mg by mouth daily.     No current facility-administered medications for this visit.     Discontinued Meds:    Medications Discontinued During This Encounter  Medication Reason  . furosemide (LASIX) 20 MG tablet Error    Patient Active Problem List   Diagnosis Date Noted  . Coronary artery disease involving native heart without angina pectoris   . S/P CABG (coronary artery bypass graft)   . Persistent atrial fibrillation (HCC)   . S/P CABG x 4 06/20/2016  . Atypical angina (  HCC) 06/12/2016  . Abnormal nuclear stress test - high risk with low EF and large infarct with peri-infarct ischemia in the inferolateral Lizardo 06/12/2016  . Cardiomyopathy, ischemic 06/12/2016  . Anemia 06/28/2013    LABS    Component Value Date/Time   NA 129 (L) 06/29/2016 0453   NA 129 (L) 06/27/2016 1146   NA 129 (L) 06/25/2016 0251   K 3.4 (L) 06/29/2016 0453   K 3.0 (L) 06/27/2016 1146   K 4.9 06/25/2016 0251   CL 89 (L) 06/29/2016 0453   CL 90 (L) 06/27/2016 1146   CL 94 (L) 06/25/2016 0251   CO2 28 06/29/2016 0453   CO2 28 06/27/2016 1146   CO2 24 06/25/2016 0251   GLUCOSE 96 06/29/2016 0453   GLUCOSE 101 (H) 06/27/2016 1146   GLUCOSE 99 06/25/2016 0251   BUN 8 06/29/2016 0453   BUN 9 06/27/2016 1146   BUN 16 06/25/2016 0251   CREATININE 0.66 06/29/2016 0453   CREATININE 0.58 06/27/2016 1146   CREATININE 0.65 06/25/2016 0251   CREATININE 0.77 05/27/2016 1551   CALCIUM 8.7 (L) 06/29/2016 0453   CALCIUM 8.4 (L) 06/27/2016 1146   CALCIUM 9.3  06/25/2016 0251   GFRNONAA >60 06/29/2016 0453   GFRNONAA >60 06/27/2016 1146   GFRNONAA >60 06/25/2016 0251   GFRAA >60 06/29/2016 0453   GFRAA >60 06/27/2016 1146   GFRAA >60 06/25/2016 0251   CMP     Component Value Date/Time   NA 129 (L) 06/29/2016 0453   K 3.4 (L) 06/29/2016 0453   CL 89 (L) 06/29/2016 0453   CO2 28 06/29/2016 0453   GLUCOSE 96 06/29/2016 0453   BUN 8 06/29/2016 0453   CREATININE 0.66 06/29/2016 0453   CREATININE 0.77 05/27/2016 1551   CALCIUM 8.7 (L) 06/29/2016 0453   PROT 7.4 06/19/2016 1227   ALBUMIN 4.5 06/19/2016 1227   AST 32 06/19/2016 1227   ALT 16 06/19/2016 1227   ALKPHOS 45 06/19/2016 1227   BILITOT 0.8 06/19/2016 1227   GFRNONAA >60 06/29/2016 0453   GFRAA >60 06/29/2016 0453       Component Value Date/Time   WBC 5.8 06/28/2016 0346   WBC 5.9 06/26/2016 0500   WBC 9.6 06/25/2016 0251   HGB 11.1 (L) 06/28/2016 0346   HGB 10.6 (L) 06/26/2016 0500   HGB 10.8 (L) 06/25/2016 0251   HGB 6.1 06/27/2013   HCT 33.8 (L) 06/28/2016 0346   HCT 31.8 (L) 06/26/2016 0500   HCT 32.9 (L) 06/25/2016 0251   HCT 14 06/27/2013   MCV 90.1 06/28/2016 0346   MCV 90.1 06/26/2016 0500   MCV 89.4 06/25/2016 0251    Lipid Panel  No results found for: CHOL, TRIG, HDL, CHOLHDL, VLDL, LDLCALC, LDLDIRECT  ABG    Component Value Date/Time   PHART 7.371 06/20/2016 2043   PCO2ART 50.7 (H) 06/20/2016 2043   PO2ART 165.0 (H) 06/20/2016 2043   HCO3 29.4 (H) 06/20/2016 2043   TCO2 26 06/21/2016 1709   O2SAT 74.6 06/22/2016 0350     No results found for: TSH BNP (last 3 results)  Recent Labs  05/27/16 1551  BNP 1,545.3*    ProBNP (last 3 results) No results for input(s): PROBNP in the last 8760 hours.  Cardiac Panel (last 3 results) No results for input(s): CKTOTAL, CKMB, TROPONINI, RELINDX in the last 72 hours.  Iron/TIBC/Ferritin/ %Sat No results found for: IRON, TIBC, FERRITIN, IRONPCTSAT   EKG Orders placed or performed during the hospital  encounter  of 06/20/16  . EKG 12-Lead  . EKG 12-Lead  . EKG 12-Lead  . EKG 12-Lead  . EKG 12-Lead  . EKG 12-Lead     Prior Assessment and Plan Problem List as of 07/17/2016 Reviewed: 06/20/2016  6:20 AM by Jed Limerick, CRNA     Cardiovascular and Mediastinum   Atypical angina (HCC)   Cardiomyopathy, ischemic   Coronary artery disease involving native heart without angina pectoris   Persistent atrial fibrillation [facility])     Other   Anemia   Last Assessment & Plan 06/28/2013 Office Visit Written 06/28/2013  4:15 PM by Nira Retort, NP    82 year old female with microcytic anemia, Hgb 6.1, unknown baseline. Blood transfusion scheduled for 11/19. Appears she was heme positive through GYN; she has had quite an interesting summer with hospitalization in [place] secondary to complicated diverticulitis, colostomy placement and reversal, and low anterior resection. Colonoscopy performed June 2014 via colostomy and rectum, limited due to poor prep but no obvious mass or polyps.   She has no evidence of hematochezia or melena. Vague upper GI symptoms to include early satiety, belching, and new-onset intermittent reflux. As she is on Plavix and ASA 325 mg daily, concern for occult upper GI source or small bowel etiology. Discussed proceeding with an EGD as first line in investigation; may ultimately need a capsule study. Colonoscopy performed this summer, and she does not have any overt signs of rectal bleeding. For this reason, proceed with EGD with Dr. Darrick Penna  and possible capsule to be scheduled if EGD negative.   Discussed in detail signs and symptoms that would necessitate urgent GI evaluation. She and her daughter stated understanding.       Abnormal nuclear stress test - high risk with low EF and large infarct with peri-infarct ischemia in the inferolateral [place]   S/P CABG x 4   S/P CABG (coronary artery bypass graft)       Imaging: Dg Chest 1 View  Result Date: 06/24/2016 CLINICAL  DATA:  82 year old female status post thoracentesis with ultrasound guidance today. Initial encounter. EXAM: CHEST 1 VIEW COMPARISON:  Chest radiographs 06/24/2016 and earlier. FINDINGS: Seated upright AP view of the chest at 1309 hours. Decreased volume of left pleural effusion, small if any residual. No pneumothorax identified. No pulmonary edema. Stable cardiomegaly and mediastinal contours. The right lung remains clear. Stable visualized osseous structures. IMPRESSION: Significantly regressed left pleural effusion and no pneumothorax following thoracentesis. Electronically Signed   By: Odessa Fleming M.D.   On: 06/24/2016 13:22   Dg Chest 2 View  Result Date: 06/26/2016 CLINICAL DATA:  Status post CABG 6 days ago, persistent shortness of breath and mild chest pain EXAM: CHEST  2 VIEW COMPARISON:  Portable chest x-ray of June 24, 2016. FINDINGS: The right lung is well-expanded and clear. On the left there is mild lower lobe atelectasis and trace of pleural fluid. The cardiac silhouette is enlarged. The pulmonary vascularity is normal. The sternal wires are intact. The retrosternal soft tissues are normal. The observed portions of the thoracic spine are unremarkable. There is old deformity of the midshaft of the right clavicle. IMPRESSION: Persistent left lower lobe atelectasis and small left pleural effusion. No CHF or pneumothorax. Electronically Signed   By: David  [place] M.D.   On: 06/26/2016 09:08   Dg Chest 2 View  Result Date: 06/24/2016 CLINICAL DATA:  Atelectasis, shortness of breath, status post CABG on June 20, 2016 EXAM: CHEST  2 VIEW COMPARISON:  Portable  chest x-ray of June 23, 2016. FINDINGS: The right lung is adequately inflated and clear. On the left there is a moderate-sized pleural effusion. The left retrocardiac region remains dense and the hemidiaphragm obscured. There is no pneumothorax. The cardiac silhouette is mildly enlarged. The pulmonary vascularity is not engorged. The  mediastinum is normal in width. The surgical sutures are intact. There it is an old fracture of the midshaft of the right clavicle. The right internal jugular venous catheter has been removed. IMPRESSION: Persistent left lower lobe atelectasis with moderate-sized pleural effusion. No pneumothorax. No pulmonary edema. Electronically Signed   By: David  [place] M.D.   On: 06/24/2016 07:22   Dg Chest 2 View  Result Date: 06/19/2016 CLINICAL DATA:  Preop for CABG . EXAM: CHEST  2 VIEW COMPARISON:  Chest x-ray of 04/28/2016 FINDINGS: The lungs are clear and somewhat hyperaerated. Mediastinal and hilar contours are unremarkable. The heart is within upper limits of normal. No acute bony abnormality seen, with probable old healed fracture of the mid right clavicle noted IMPRESSION: No active cardiopulmonary disease. Electronically Signed   By: Dwyane Dee M.D.   On: 06/19/2016 13:41   Dg Chest Port 1 View  Result Date: 06/23/2016 CLINICAL DATA:  Chest soreness after CABG EXAM: PORTABLE CHEST 1 VIEW COMPARISON:  Yesterday FINDINGS: Right IJ sheath remains in place. Dense opacity behind the heart with volume loss is unchanged. No pneumothorax. No pulmonary edema. Stable cardiopericardial enlargement. Recent CABG. Remote right clavicle fracture. IMPRESSION: Unchanged retrocardiac atelectasis and pleural fluid. Electronically Signed   By: Marnee Spring M.D.   On: 06/23/2016 07:14   Dg Chest Port 1 View  Result Date: 06/22/2016 CLINICAL DATA:  Patient status post CABG procedure. EXAM: PORTABLE CHEST 1 VIEW COMPARISON:  Chest radiograph 06/21/2016. FINDINGS: Right IJ sheath tip projects over the superior vena cava. Stable enlarged cardiac and mediastinal contours status post median sternotomy. Interval removal mediastinal drain and left chest tubes. Persistent consolidation left lower hemi thorax. Probable small left pleural effusion. No pneumothorax. IMPRESSION: Left lower lobe atelectasis and probable small left  pleural effusion. Interval removal mediastinal drain and chest tubes. Electronically Signed   By: Annia Belt M.D.   On: 06/22/2016 08:54   Dg Chest Port 1 View  Result Date: 06/21/2016 CLINICAL DATA:  Followup CABG EXAM: PORTABLE CHEST 1 VIEW COMPARISON:  06/20/2016 FINDINGS: Endotracheal tube and nasogastric tube have been removed. Swan-Ganz catheter has its tip in the right main pulmonary artery. Chest tube/mediastinal drains remain in place. No pneumothorax. Atelectasis in the left lower lobe has worsened somewhat following extubation. IMPRESSION: Some worsening of left lower lobe atelectasis postextubation. Electronically Signed   By: Paulina Fusi M.D.   On: 06/21/2016 07:42   Dg Chest Port 1 View  Result Date: 06/20/2016 CLINICAL DATA:  Status post CABG.  History of COPD. EXAM: PORTABLE CHEST 1 VIEW COMPARISON:  PA and lateral chest x-ray of June 19, 2016. FINDINGS: The lungs are well-expanded. There are linear densities in the left mid and lower lung. There is no pneumothorax or pleural effusion. The heart and pulmonary vascularity are normal. The mediastinal drain and left chest tube are in reasonable position. The endotracheal tube tip projects 5 point 1 cm above the carina. The esophagogastric tube tip in proximal port lie below the GE junction. The Swan-Ganz catheter tip projects over the proximal right main pulmonary artery. IMPRESSION: Mild subsegmental atelectasis in the left mid and lower lung. No pneumothorax or pleural effusion. No pulmonary edema. The  support tubes are in reasonable position. Electronically Signed   By: David  [place] M.D.   On: 06/20/2016 15:11   Ir Thoracentesis Asp Pleural Space W/img Guide  Result Date: 06/24/2016 INDICATION: Status post CABG. Shortness of breath. Left-sided pleural effusion. Request therapeutic thoracentesis. EXAM: ULTRASOUND GUIDED LEFT THORACENTESIS MEDICATIONS: None. COMPLICATIONS: None immediate. Postprocedural chest x-ray negative for  pneumothorax. PROCEDURE: An ultrasound guided thoracentesis was thoroughly discussed with the patient and questions answered. The benefits, risks, alternatives and complications were also discussed. The patient understands and wishes to proceed with the procedure. Written consent was obtained. Ultrasound was performed to localize and mark an adequate pocket of fluid in the left chest. The area was then prepped and draped in the normal sterile fashion. 1% Lidocaine was used for local anesthesia. Under ultrasound guidance a Safe-T-Centesis catheter was introduced. Thoracentesis was performed. The catheter was removed and a dressing applied. FINDINGS: A total of approximately 550 mL of serosanguinous fluid was removed. IMPRESSION: Successful ultrasound guided left thoracentesis yielding 550 mL of pleural fluid. Read by: Brayton ElKevin Bruning PA-C Electronically Signed   By: Jolaine ClickArthur  Hoss M.D.   On: 06/24/2016 14:15

## 2016-07-18 ENCOUNTER — Telehealth: Payer: Self-pay

## 2016-07-18 LAB — BASIC METABOLIC PANEL
BUN: 12 mg/dL (ref 7–25)
CO2: 23 mmol/L (ref 20–31)
Calcium: 9.1 mg/dL (ref 8.6–10.4)
Chloride: 95 mmol/L — ABNORMAL LOW (ref 98–110)
Creat: 0.92 mg/dL — ABNORMAL HIGH (ref 0.60–0.88)
Glucose, Bld: 111 mg/dL — ABNORMAL HIGH (ref 65–99)
POTASSIUM: 3.5 mmol/L (ref 3.5–5.3)
SODIUM: 132 mmol/L — AB (ref 135–146)

## 2016-07-18 MED ORDER — POTASSIUM CHLORIDE ER 10 MEQ PO TBCR: EXTENDED_RELEASE_TABLET | ORAL | 3 refills | Status: DC

## 2016-07-18 NOTE — Telephone Encounter (Signed)
-----   Message from Jodelle Gross, NP sent at 07/18/2016  6:36 AM EST ----- Potassium is not optimal for patient with CAD. Should be 4.0. She is on 10 mEq BID. Will need to take 20 mEq in am and 10 mEq in the pm. Please send new Rx to the [name] in [facility] for 90 day supply.  Thank you.

## 2016-07-18 NOTE — Telephone Encounter (Signed)
I spoke with daughter,she will pick up new rx for increased K+ dose today

## 2016-07-21 ENCOUNTER — Other Ambulatory Visit: Payer: Self-pay | Admitting: Thoracic Surgery (Cardiothoracic Vascular Surgery)

## 2016-07-21 DIAGNOSIS — Z951 Presence of aortocoronary bypass graft: Secondary | ICD-10-CM

## 2016-07-22 ENCOUNTER — Ambulatory Visit (INDEPENDENT_AMBULATORY_CARE_PROVIDER_SITE_OTHER): Payer: Self-pay | Admitting: Physician Assistant

## 2016-07-22 ENCOUNTER — Ambulatory Visit
Admission: RE | Admit: 2016-07-22 | Discharge: 2016-07-22 | Disposition: A | Discharge status: Home / Self Care | Payer: Medicare Other | Source: Ambulatory Visit | Attending: Thoracic Surgery (Cardiothoracic Vascular Surgery) | Admitting: Thoracic Surgery (Cardiothoracic Vascular Surgery)

## 2016-07-22 VITALS — BP 136/77 | HR 53 | Resp 16 | Ht 60.0 in | Wt 122.0 lb

## 2016-07-22 DIAGNOSIS — I2510 Atherosclerotic heart disease of native coronary artery without angina pectoris: Secondary | ICD-10-CM

## 2016-07-22 DIAGNOSIS — Z951 Presence of aortocoronary bypass graft: Secondary | ICD-10-CM

## 2016-07-22 DIAGNOSIS — J90 Pleural effusion, not elsewhere classified: Secondary | ICD-10-CM | POA: Diagnosis not present

## 2016-07-22 NOTE — Progress Notes (Signed)
HPI:  Patient returns for routine postoperative follow-up having undergone S/P CABG x 4 with Radial Artery on 07/20/2016. The patient's early postoperative recovery while in the hospital was notable for Atrial Fibrillation and she was started on Eliquis.  Since hospital discharge the patient reports she is doing well.  She states she is up and about independently.  She states occasionally she doesn't sleep at night and the next day she notices she has a harder time.  She is planning a move to [place] in the next 2 weeks.  She states she is not eating much due to taking so much medication and is unable to eat because she feels full.  She asks if we can simplify her medication list at all.  Finally she states her incisions are doing well for the most part, but her home nurse told her to have [place] check a "bump" along her incision on her arm.   Current Outpatient Prescriptions  Medication Sig Dispense Refill  . alendronate (FOSAMAX) 70 MG tablet Take 70 mg by mouth once a week. Takes on Sunday    . ALPRAZolam (XANAX) 0.25 MG tablet Take 0.25 mg by mouth at bedtime as needed for anxiety.    Marland Kitchen amiodarone (PACERONE) 200 MG tablet Take 1 tablet (200 mg total) by mouth daily. 180 tablet 1  . apixaban (ELIQUIS) 2.5 MG TABS tablet Take 1 tablet (2.5 mg total) by mouth 2 (two) times daily. 180 tablet 1  . aspirin EC 81 MG tablet Take 81 mg by mouth daily.    . carvedilol (COREG) 6.25 MG tablet Take 1 tablet (6.25 mg total) by mouth 2 (two) times daily with a meal. 180 tablet 1  . cholecalciferol (VITAMIN D) 1000 UNITS tablet Take 1,000 Units by mouth daily.    . furosemide (LASIX) 20 MG tablet Take 1 tablet (20 mg total) by mouth daily. 90 tablet 1  . indapamide (LOZOL) 1.25 MG tablet Take 1 tablet (1.25 mg total) by mouth daily. 90 tablet 1  . levothyroxine (SYNTHROID, LEVOTHROID) 50 MCG tablet Take 50 mcg by mouth daily before breakfast.     . losartan (COZAAR) 25 MG tablet Take 1 tablet (25 mg total) by  mouth daily. 90 tablet 3  . pantoprazole (PROTONIX) 40 MG tablet Take 40 mg by mouth daily.    . potassium chloride (K-DUR) 10 MEQ tablet Take 20 meq in the am (2 tablets)  and 10 meq in the pm 270 tablet 3  . simvastatin (ZOCOR) 5 MG tablet Take 1 tablet (5 mg total) by mouth daily at 6 PM. 90 tablet 1   No current facility-administered medications for this visit.     Physical Exam   BP 136/77 (BP Location: Right Arm, Patient Position: Sitting, Cuff Size: Large)   Pulse (!) 53   Resp 16   Ht 5' (1.524 m)   Wt 122 lb (55.3 kg)   BMI 23.83 kg/m   Gen: no apparent distress Heart: RRR, Bradycardic Lungs: CTA bilaterally Ext: no edema present Wounds: well healed, small bump along distal aspect of radial harvest site with expulsion of stitch.  Diagnostic Tests:  CXR: no pleural effusion, no pneumothorax, sternal wires are intact  A/P:  1. S/P CABG x 4 doing well- she had some Atrial Fibrillation post operatively.  She is maintaining NSR, but her HR is low in the 50s.  I will plan to d/c Amiodarone today.  She has also completed 30 days of Imdur for her radial graft and  she was also instructed she could discontinue this medication 2. GI-trouble with oral intake, due to amount of medications, made some medication adjustments today.  Hopefully this can help.  I encouraged patient to use ensure supplements and try to space medication administration throughout the day 3. Dispo- patient doing well, S/P CABG x 4, may resume driving, continue ambulation, [place] Okay to move from our standpoint, will be happy to see her if further problems occur while she is in [place], otherwise RTC prn  Chasity Outten, PA-C [facility] [phone]

## 2016-08-14 DIAGNOSIS — N390 Urinary tract infection, site not specified: Secondary | ICD-10-CM | POA: Diagnosis not present

## 2016-08-15 DIAGNOSIS — I2510 Atherosclerotic heart disease of native coronary artery without angina pectoris: Secondary | ICD-10-CM | POA: Diagnosis not present

## 2016-08-15 DIAGNOSIS — I6529 Occlusion and stenosis of unspecified carotid artery: Secondary | ICD-10-CM | POA: Diagnosis not present

## 2016-08-15 DIAGNOSIS — E559 Vitamin D deficiency, unspecified: Secondary | ICD-10-CM | POA: Diagnosis not present

## 2016-08-15 DIAGNOSIS — D638 Anemia in other chronic diseases classified elsewhere: Secondary | ICD-10-CM | POA: Diagnosis not present

## 2016-08-15 DIAGNOSIS — E784 Other hyperlipidemia: Secondary | ICD-10-CM | POA: Diagnosis not present

## 2016-08-15 DIAGNOSIS — I10 Essential (primary) hypertension: Secondary | ICD-10-CM | POA: Diagnosis not present

## 2016-08-15 DIAGNOSIS — E039 Hypothyroidism, unspecified: Secondary | ICD-10-CM | POA: Diagnosis not present

## 2016-08-15 DIAGNOSIS — Z0000 Encounter for general adult medical examination without abnormal findings: Secondary | ICD-10-CM | POA: Diagnosis not present

## 2016-08-21 DIAGNOSIS — M899 Disorder of bone, unspecified: Secondary | ICD-10-CM | POA: Diagnosis not present

## 2016-08-21 DIAGNOSIS — M85832 Other specified disorders of bone density and structure, left forearm: Secondary | ICD-10-CM | POA: Diagnosis not present

## 2016-08-21 DIAGNOSIS — N951 Menopausal and female climacteric states: Secondary | ICD-10-CM | POA: Diagnosis not present

## 2016-08-24 DIAGNOSIS — S92351A Displaced fracture of fifth metatarsal bone, right foot, initial encounter for closed fracture: Secondary | ICD-10-CM | POA: Diagnosis not present

## 2016-08-24 DIAGNOSIS — S92354A Nondisplaced fracture of fifth metatarsal bone, right foot, initial encounter for closed fracture: Secondary | ICD-10-CM | POA: Diagnosis not present

## 2016-08-24 DIAGNOSIS — I10 Essential (primary) hypertension: Secondary | ICD-10-CM | POA: Diagnosis not present

## 2016-08-27 DIAGNOSIS — S92354A Nondisplaced fracture of fifth metatarsal bone, right foot, initial encounter for closed fracture: Secondary | ICD-10-CM | POA: Diagnosis not present

## 2016-08-28 DIAGNOSIS — I4891 Unspecified atrial fibrillation: Secondary | ICD-10-CM | POA: Diagnosis not present

## 2016-08-28 DIAGNOSIS — I10 Essential (primary) hypertension: Secondary | ICD-10-CM | POA: Diagnosis not present

## 2016-08-28 DIAGNOSIS — J90 Pleural effusion, not elsewhere classified: Secondary | ICD-10-CM | POA: Diagnosis not present

## 2016-08-28 DIAGNOSIS — I2510 Atherosclerotic heart disease of native coronary artery without angina pectoris: Secondary | ICD-10-CM | POA: Diagnosis not present

## 2016-08-28 DIAGNOSIS — I519 Heart disease, unspecified: Secondary | ICD-10-CM | POA: Diagnosis not present

## 2016-08-30 DIAGNOSIS — E871 Hypo-osmolality and hyponatremia: Secondary | ICD-10-CM | POA: Diagnosis not present

## 2016-08-30 DIAGNOSIS — Z79899 Other long term (current) drug therapy: Secondary | ICD-10-CM | POA: Diagnosis not present

## 2016-08-30 DIAGNOSIS — Z7901 Long term (current) use of anticoagulants: Secondary | ICD-10-CM | POA: Diagnosis not present

## 2016-08-30 DIAGNOSIS — I252 Old myocardial infarction: Secondary | ICD-10-CM | POA: Diagnosis not present

## 2016-08-30 DIAGNOSIS — R5383 Other fatigue: Secondary | ICD-10-CM | POA: Diagnosis not present

## 2016-08-30 DIAGNOSIS — R531 Weakness: Secondary | ICD-10-CM | POA: Diagnosis not present

## 2016-08-30 DIAGNOSIS — I10 Essential (primary) hypertension: Secondary | ICD-10-CM | POA: Diagnosis not present

## 2016-08-30 DIAGNOSIS — Z7982 Long term (current) use of aspirin: Secondary | ICD-10-CM | POA: Diagnosis not present

## 2016-08-30 DIAGNOSIS — Z951 Presence of aortocoronary bypass graft: Secondary | ICD-10-CM | POA: Diagnosis not present

## 2016-08-30 DIAGNOSIS — E039 Hypothyroidism, unspecified: Secondary | ICD-10-CM | POA: Diagnosis not present

## 2016-08-31 DIAGNOSIS — R531 Weakness: Secondary | ICD-10-CM | POA: Diagnosis not present

## 2016-08-31 DIAGNOSIS — E871 Hypo-osmolality and hyponatremia: Secondary | ICD-10-CM | POA: Diagnosis not present

## 2016-08-31 DIAGNOSIS — R5383 Other fatigue: Secondary | ICD-10-CM | POA: Diagnosis not present

## 2016-08-31 DIAGNOSIS — K56609 Unspecified intestinal obstruction, unspecified as to partial versus complete obstruction: Secondary | ICD-10-CM | POA: Diagnosis not present

## 2016-08-31 DIAGNOSIS — K5732 Diverticulitis of large intestine without perforation or abscess without bleeding: Secondary | ICD-10-CM | POA: Diagnosis not present

## 2016-08-31 DIAGNOSIS — R4182 Altered mental status, unspecified: Secondary | ICD-10-CM | POA: Diagnosis not present

## 2016-09-02 DIAGNOSIS — I519 Heart disease, unspecified: Secondary | ICD-10-CM | POA: Diagnosis not present

## 2016-09-02 DIAGNOSIS — J90 Pleural effusion, not elsewhere classified: Secondary | ICD-10-CM | POA: Diagnosis not present

## 2016-09-02 DIAGNOSIS — I517 Cardiomegaly: Secondary | ICD-10-CM | POA: Diagnosis not present

## 2016-09-02 DIAGNOSIS — R0602 Shortness of breath: Secondary | ICD-10-CM | POA: Diagnosis not present

## 2016-09-04 DIAGNOSIS — I779 Disorder of arteries and arterioles, unspecified: Secondary | ICD-10-CM | POA: Diagnosis not present

## 2016-09-04 DIAGNOSIS — G458 Other transient cerebral ischemic attacks and related syndromes: Secondary | ICD-10-CM | POA: Diagnosis not present

## 2016-09-11 DIAGNOSIS — I4891 Unspecified atrial fibrillation: Secondary | ICD-10-CM | POA: Diagnosis not present

## 2016-09-15 DIAGNOSIS — N390 Urinary tract infection, site not specified: Secondary | ICD-10-CM | POA: Diagnosis not present

## 2016-09-15 DIAGNOSIS — I6529 Occlusion and stenosis of unspecified carotid artery: Secondary | ICD-10-CM | POA: Diagnosis not present

## 2016-09-15 DIAGNOSIS — I10 Essential (primary) hypertension: Secondary | ICD-10-CM | POA: Diagnosis not present

## 2016-09-15 DIAGNOSIS — E039 Hypothyroidism, unspecified: Secondary | ICD-10-CM | POA: Diagnosis not present

## 2016-09-15 DIAGNOSIS — I2510 Atherosclerotic heart disease of native coronary artery without angina pectoris: Secondary | ICD-10-CM | POA: Diagnosis not present

## 2016-09-15 DIAGNOSIS — E871 Hypo-osmolality and hyponatremia: Secondary | ICD-10-CM | POA: Diagnosis not present

## 2016-09-15 DIAGNOSIS — D638 Anemia in other chronic diseases classified elsewhere: Secondary | ICD-10-CM | POA: Diagnosis not present

## 2016-09-18 DIAGNOSIS — S92354D Nondisplaced fracture of fifth metatarsal bone, right foot, subsequent encounter for fracture with routine healing: Secondary | ICD-10-CM | POA: Diagnosis not present

## 2016-09-18 DIAGNOSIS — I4891 Unspecified atrial fibrillation: Secondary | ICD-10-CM | POA: Diagnosis not present

## 2016-09-18 DIAGNOSIS — I10 Essential (primary) hypertension: Secondary | ICD-10-CM | POA: Diagnosis not present

## 2016-09-18 DIAGNOSIS — I2510 Atherosclerotic heart disease of native coronary artery without angina pectoris: Secondary | ICD-10-CM | POA: Diagnosis not present

## 2016-09-18 DIAGNOSIS — I519 Heart disease, unspecified: Secondary | ICD-10-CM | POA: Diagnosis not present

## 2016-09-25 DIAGNOSIS — E871 Hypo-osmolality and hyponatremia: Secondary | ICD-10-CM | POA: Diagnosis not present

## 2016-09-25 DIAGNOSIS — N390 Urinary tract infection, site not specified: Secondary | ICD-10-CM | POA: Diagnosis not present

## 2016-09-26 ENCOUNTER — Telehealth: Payer: Self-pay

## 2016-09-26 NOTE — Telephone Encounter (Signed)
LMOM to call back with update on current health

## 2016-10-03 DIAGNOSIS — I10 Essential (primary) hypertension: Secondary | ICD-10-CM | POA: Diagnosis not present

## 2016-10-14 DIAGNOSIS — S92354D Nondisplaced fracture of fifth metatarsal bone, right foot, subsequent encounter for fracture with routine healing: Secondary | ICD-10-CM | POA: Diagnosis not present

## 2016-10-14 DIAGNOSIS — N390 Urinary tract infection, site not specified: Secondary | ICD-10-CM | POA: Diagnosis not present

## 2016-10-17 DIAGNOSIS — I10 Essential (primary) hypertension: Secondary | ICD-10-CM | POA: Diagnosis not present

## 2016-10-17 DIAGNOSIS — I2510 Atherosclerotic heart disease of native coronary artery without angina pectoris: Secondary | ICD-10-CM | POA: Diagnosis not present

## 2016-10-17 DIAGNOSIS — I4891 Unspecified atrial fibrillation: Secondary | ICD-10-CM | POA: Diagnosis not present

## 2016-10-17 DIAGNOSIS — I519 Heart disease, unspecified: Secondary | ICD-10-CM | POA: Diagnosis not present

## 2016-10-27 DIAGNOSIS — N390 Urinary tract infection, site not specified: Secondary | ICD-10-CM | POA: Diagnosis not present

## 2016-10-27 DIAGNOSIS — N3000 Acute cystitis without hematuria: Secondary | ICD-10-CM | POA: Diagnosis not present

## 2016-11-03 DIAGNOSIS — N358 Other urethral stricture: Secondary | ICD-10-CM | POA: Diagnosis not present

## 2016-11-03 DIAGNOSIS — N3000 Acute cystitis without hematuria: Secondary | ICD-10-CM | POA: Diagnosis not present

## 2016-11-13 DIAGNOSIS — I2510 Atherosclerotic heart disease of native coronary artery without angina pectoris: Secondary | ICD-10-CM | POA: Diagnosis not present

## 2016-11-13 DIAGNOSIS — I6529 Occlusion and stenosis of unspecified carotid artery: Secondary | ICD-10-CM | POA: Diagnosis not present

## 2016-11-13 DIAGNOSIS — I10 Essential (primary) hypertension: Secondary | ICD-10-CM | POA: Diagnosis not present

## 2016-11-13 DIAGNOSIS — E039 Hypothyroidism, unspecified: Secondary | ICD-10-CM | POA: Diagnosis not present

## 2016-11-20 DIAGNOSIS — I4891 Unspecified atrial fibrillation: Secondary | ICD-10-CM | POA: Diagnosis not present

## 2016-11-20 DIAGNOSIS — I519 Heart disease, unspecified: Secondary | ICD-10-CM | POA: Diagnosis not present

## 2016-11-20 DIAGNOSIS — I10 Essential (primary) hypertension: Secondary | ICD-10-CM | POA: Diagnosis not present

## 2016-11-20 DIAGNOSIS — I2510 Atherosclerotic heart disease of native coronary artery without angina pectoris: Secondary | ICD-10-CM | POA: Diagnosis not present

## 2016-11-24 DIAGNOSIS — I519 Heart disease, unspecified: Secondary | ICD-10-CM | POA: Diagnosis not present

## 2016-11-24 DIAGNOSIS — E7800 Pure hypercholesterolemia, unspecified: Secondary | ICD-10-CM | POA: Diagnosis not present

## 2016-11-24 DIAGNOSIS — I10 Essential (primary) hypertension: Secondary | ICD-10-CM | POA: Diagnosis not present

## 2016-11-24 DIAGNOSIS — I2510 Atherosclerotic heart disease of native coronary artery without angina pectoris: Secondary | ICD-10-CM | POA: Diagnosis not present

## 2016-12-02 DIAGNOSIS — N390 Urinary tract infection, site not specified: Secondary | ICD-10-CM | POA: Diagnosis not present

## 2016-12-02 DIAGNOSIS — N3000 Acute cystitis without hematuria: Secondary | ICD-10-CM | POA: Diagnosis not present

## 2016-12-02 DIAGNOSIS — N358 Other urethral stricture: Secondary | ICD-10-CM | POA: Diagnosis not present

## 2016-12-15 DIAGNOSIS — I519 Heart disease, unspecified: Secondary | ICD-10-CM | POA: Diagnosis not present

## 2016-12-15 DIAGNOSIS — I4891 Unspecified atrial fibrillation: Secondary | ICD-10-CM | POA: Diagnosis not present

## 2016-12-15 DIAGNOSIS — I2510 Atherosclerotic heart disease of native coronary artery without angina pectoris: Secondary | ICD-10-CM | POA: Diagnosis not present

## 2016-12-15 DIAGNOSIS — I10 Essential (primary) hypertension: Secondary | ICD-10-CM | POA: Diagnosis not present

## 2016-12-15 DIAGNOSIS — E871 Hypo-osmolality and hyponatremia: Secondary | ICD-10-CM | POA: Diagnosis not present

## 2017-01-02 DIAGNOSIS — M1711 Unilateral primary osteoarthritis, right knee: Secondary | ICD-10-CM | POA: Diagnosis not present

## 2017-01-02 DIAGNOSIS — Z6826 Body mass index (BMI) 26.0-26.9, adult: Secondary | ICD-10-CM | POA: Diagnosis not present

## 2017-01-02 DIAGNOSIS — M25561 Pain in right knee: Secondary | ICD-10-CM | POA: Diagnosis not present

## 2017-01-21 DIAGNOSIS — N3000 Acute cystitis without hematuria: Secondary | ICD-10-CM | POA: Diagnosis not present

## 2017-02-17 ENCOUNTER — Other Ambulatory Visit (HOSPITAL_COMMUNITY): Payer: Self-pay | Admitting: Pulmonary Disease

## 2017-02-17 DIAGNOSIS — Z1231 Encounter for screening mammogram for malignant neoplasm of breast: Secondary | ICD-10-CM

## 2017-02-19 DIAGNOSIS — N390 Urinary tract infection, site not specified: Secondary | ICD-10-CM | POA: Diagnosis not present

## 2017-02-20 ENCOUNTER — Ambulatory Visit (HOSPITAL_COMMUNITY)
Admission: RE | Admit: 2017-02-20 | Discharge: 2017-02-20 | Disposition: A | Discharge status: Home / Self Care | Payer: Medicare Other | Source: Ambulatory Visit | Attending: Pulmonary Disease | Admitting: Pulmonary Disease

## 2017-02-20 DIAGNOSIS — Z1231 Encounter for screening mammogram for malignant neoplasm of breast: Secondary | ICD-10-CM | POA: Diagnosis not present

## 2017-03-02 DIAGNOSIS — I10 Essential (primary) hypertension: Secondary | ICD-10-CM | POA: Diagnosis not present

## 2017-03-02 DIAGNOSIS — I5022 Chronic systolic (congestive) heart failure: Secondary | ICD-10-CM | POA: Diagnosis not present

## 2017-03-02 DIAGNOSIS — K210 Gastro-esophageal reflux disease with esophagitis: Secondary | ICD-10-CM | POA: Diagnosis not present

## 2017-03-02 DIAGNOSIS — I2510 Atherosclerotic heart disease of native coronary artery without angina pectoris: Secondary | ICD-10-CM | POA: Diagnosis not present

## 2017-04-09 DIAGNOSIS — N3000 Acute cystitis without hematuria: Secondary | ICD-10-CM | POA: Diagnosis not present

## 2017-04-09 DIAGNOSIS — I482 Chronic atrial fibrillation: Secondary | ICD-10-CM | POA: Diagnosis not present

## 2017-04-09 DIAGNOSIS — I2510 Atherosclerotic heart disease of native coronary artery without angina pectoris: Secondary | ICD-10-CM | POA: Diagnosis not present

## 2017-04-09 DIAGNOSIS — I10 Essential (primary) hypertension: Secondary | ICD-10-CM | POA: Diagnosis not present

## 2017-04-21 ENCOUNTER — Encounter: Payer: Self-pay | Admitting: Adult Health

## 2017-04-21 ENCOUNTER — Ambulatory Visit (INDEPENDENT_AMBULATORY_CARE_PROVIDER_SITE_OTHER): Payer: Medicare Other | Admitting: Adult Health

## 2017-04-21 VITALS — BP 132/66 | HR 67 | Ht 60.0 in | Wt 122.0 lb

## 2017-04-21 DIAGNOSIS — I2510 Atherosclerotic heart disease of native coronary artery without angina pectoris: Secondary | ICD-10-CM

## 2017-04-21 DIAGNOSIS — D649 Anemia, unspecified: Secondary | ICD-10-CM | POA: Diagnosis not present

## 2017-04-21 DIAGNOSIS — Z79899 Other long term (current) drug therapy: Secondary | ICD-10-CM | POA: Diagnosis not present

## 2017-04-21 DIAGNOSIS — R309 Painful micturition, unspecified: Secondary | ICD-10-CM

## 2017-04-21 DIAGNOSIS — I10 Essential (primary) hypertension: Secondary | ICD-10-CM | POA: Diagnosis not present

## 2017-04-21 MED ORDER — FUROSEMIDE 20 MG PO TABS: 20.0000 mg | ORAL_TABLET | Freq: Every day | ORAL | 3 refills | Status: AC

## 2017-04-21 MED ORDER — LOSARTAN POTASSIUM 25 MG PO TABS
25.0000 mg | ORAL_TABLET | Freq: Every day | ORAL | 3 refills | Status: AC
Start: 2017-04-21 — End: 2017-07-20

## 2017-04-21 MED ORDER — SIMVASTATIN 5 MG PO TABS: 5.0000 mg | ORAL_TABLET | Freq: Every day | ORAL | 3 refills | Status: AC

## 2017-04-21 MED ORDER — APIXABAN 2.5 MG PO TABS: 2.5000 mg | ORAL_TABLET | Freq: Two times a day (BID) | ORAL | 3 refills | Status: AC

## 2017-04-21 MED ORDER — CARVEDILOL 6.25 MG PO TABS: 6.2500 mg | ORAL_TABLET | Freq: Two times a day (BID) | ORAL | 3 refills | Status: AC

## 2017-04-21 MED ORDER — POTASSIUM CHLORIDE ER 10 MEQ PO TBCR: EXTENDED_RELEASE_TABLET | ORAL | 3 refills | Status: AC

## 2017-04-21 NOTE — Progress Notes (Signed)
Cardiology Office Note   Date:  04/21/2017   ID:  Cheryl [name], DOB 09-12-1932, MRN [id number]  PCP:  Kari Baars, MD  Cardiologist:  Eden Emms Chief Complaint  Patient presents with  . Coronary Artery Disease  . Congestive Heart Failure      History of Present Illness: Cheryl [name] is a 83 y.o. female who presents for ongoing assessment and management of coronary artery disease, with most recent cardiac catheterization November 2017 revealing chronically occluded circumflex and RCA both filling via collaterals, LAD itself is a long tubular 70% as well as the diagonal having focal 80%. Subsequentlyunderwent CABG x 4 (LIMA-LAD, SVG-PD, SVG-1st DIAG, L RADIAL-OM 1 ) on 06/20/16. She had post operative atrial fib. An echocardiogram showed severe left ventricular dysfunction with ejection fraction of 25%. She is also started on Eliquis. On last office visit in 2017, the patient was planning on moving to [facility] and had been established with a cardiologist there.  She is here today for her annual cardiology evaluation. She was last seen by cardiology in [place] in early May 2018. 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 She is followed by Dr. Tory Emerald, [place], [place].   She denies recurrent chest pain dizziness shortness of breath or fatigue. She remains very busy, mows her lawn, cleans her home, visits friends goes shopping and "I'm always doing something" without any associated complaints. She is medically compliant.  Past Medical History:  Diagnosis Date  . Anginal pain (HCC)   . Anxiety   . Bulging discs   . COPD (chronic obstructive pulmonary disease) (HCC)   . Coronary artery disease   . Degenerative disc disease   . Diverticulitis    complicated, with microperforation, sigmoid stricture  . Dyspnea    more at night  . GERD (gastroesophageal reflux disease)   . Hypertension   . Hypothyroidism   .  Myocardial infarction (HCC)   . Stroke [facility]    tia's    Past Surgical History:  Procedure Laterality Date  . APPENDECTOMY    . CARDIAC CATHETERIZATION N/A 06/12/2016   Procedure: Left Heart Cath and Coronary Angiography;  Surgeon: Marykay Lex, MD;  Location: [facility] INVASIVE CV LAB;  Service: Cardiovascular;  Laterality: N/A;  . COCCYX REMOVAL    . COLONOSCOPY  June 2014   Dr. Aliene Beams in [place]: via colostomy and rectum. scattered diverticula. no obvious polyps. Hemorrhoids noted in anal canal. Exam limited due to poor prep  . COLOSTOMY  Dec 13, 2012   Dec 13, 2012 placed. Reversed   . CORONARY ARTERY BYPASS GRAFT N/A 06/20/2016   Procedure: CORONARY ARTERY BYPASS GRAFTING (CABG)x4 EVH LEFT GREATER SAPHENOUS VEIN  LIMA-LAD SVG-PD SVG-DIAD L RADIAL-OM;  Surgeon: Loreli Slot, MD;  Location: [facility] OR;  Service: Open Heart Surgery;  Laterality: N/A;  . ESOPHAGOGASTRODUODENOSCOPY N/A 07/01/2013   Procedure: ESOPHAGOGASTRODUODENOSCOPY (EGD);  Surgeon: West Bali, MD;  Location: [facility] ENDO SUITE;  Service: Endoscopy;  Laterality: N/A;  3:00PM  . FLEXIBLE SIGMOIDOSCOPY  December 08, 2012   Dr. Kerby Less in [place]: very edematous sigmoid colon, unable to advance scope past 25-30 cm despite attempt with peds scope.   . IR GENERIC HISTORICAL  06/24/2016   IR THORACENTESIS ASP PLEURAL SPACE W/IMG GUIDE 06/24/2016 Brayton El, PA-C [facility]-INTERV RAD  . KNEE ARTHROSCOPY Right   . LAPAROSCOPIC LOW ANTERIOR RESECTION  summer 2014   with reversal of colostomy  .  LAPAROSCOPY  May 2014  . RADIAL ARTERY HARVEST Left 06/20/2016   Procedure: RADIAL ARTERY HARVEST;  Surgeon: Loreli Slot, MD;  Location: [facility] OR;  Service: Open Heart Surgery;  Laterality: Left;  . TEE WITHOUT CARDIOVERSION N/A 06/20/2016   Procedure: TRANSESOPHAGEAL ECHOCARDIOGRAM (TEE);  Surgeon: Loreli Slot, MD;  Location: [facility] OR;  Service: Open Heart Surgery;  Laterality: N/A;     Current Outpatient  Prescriptions  Medication Sig Dispense Refill  . ALPRAZolam (XANAX) 0.25 MG tablet Take 0.25 mg by mouth at bedtime as needed for anxiety.    Marland Kitchen apixaban (ELIQUIS) 2.5 MG TABS tablet Take 1 tablet (2.5 mg total) by mouth 2 (two) times daily. 180 tablet 1  . aspirin EC 81 MG tablet Take 81 mg by mouth daily.    . carvedilol (COREG) 6.25 MG tablet Take 1 tablet (6.25 mg total) by mouth 2 (two) times daily with a meal. 180 tablet 1  . cholecalciferol (VITAMIN D) 1000 UNITS tablet Take 1,000 Units by mouth daily.    . furosemide (LASIX) 20 MG tablet Take 1 tablet (20 mg total) by mouth daily. 90 tablet 1  . levothyroxine (SYNTHROID, LEVOTHROID) 50 MCG tablet Take 50 mcg by mouth daily before breakfast.     . losartan (COZAAR) 25 MG tablet Take 1 tablet (25 mg total) by mouth daily. 90 tablet 3  . pantoprazole (PROTONIX) 40 MG tablet Take 40 mg by mouth daily.    . potassium chloride (K-DUR) 10 MEQ tablet Take 20 meq in the am (2 tablets)  and 10 meq in the pm 270 tablet 3  . simvastatin (ZOCOR) 5 MG tablet Take 1 tablet (5 mg total) by mouth daily at 6 PM. 90 tablet 1  . vitamin B-12 (CYANOCOBALAMIN) 500 MCG tablet Take 500 mcg by mouth daily.     No current facility-administered medications for this visit.     Allergies:   Penicillins    Social History:  The patient  reports that she has never smoked. She has never used smokeless tobacco. She reports that she drinks alcohol. She reports that she does not use drugs.   Family History:  The patient's family history includes Heart disease in her mother and sister; Stroke in her father.    ROS: All other systems are reviewed and negative. Unless otherwise mentioned in H&P    PHYSICAL EXAM: VS:  BP 132/66   Pulse 67   Ht 5' (1.524 m)   Wt 122 lb (55.3 kg)   SpO2 97%   BMI 23.83 kg/m  , BMI Body mass index is 23.83 kg/m. GEN: Well nourished, well developed, in no acute distress  HEENT: normal  Neck: no JVD, carotid bruits, or  masses Cardiac: RRR; no murmurs, rubs, or gallops,no edema  Respiratory:  Clear to auscultation bilaterally, normal work of breathing GI: soft, nontender, nondistended, + BS MS: no deformity or atrophy varicosities noted in the lower extremities. Skin: warm and dry, no rash Neuro:  Strength and sensation are intact Psych: euthymic mood, full affect   Recent Labs: 05/27/2016: Brain Natriuretic Peptide 1,545.3 06/19/2016: ALT 16 06/28/2016: Hemoglobin 11.1; Magnesium 1.7; Platelets 283 07/17/2016: BUN 12; Creat 0.92; Potassium 3.5; Sodium 132    Lipid Panel No results found for: CHOL, TRIG, HDL, CHOLHDL, VLDL, LDLCALC, LDLDIRECT    Wt Readings from Last 3 Encounters:  04/21/17 122 lb (55.3 kg)  07/22/16 122 lb (55.3 kg)  07/17/16 122 lb (55.3 kg)      Other studies  Reviewed: Cardiac Cath 06/12/2016 Conclusion     Ost RCA to Mid RCA lesion, 100 %stenosed. The entire distal RCA system with 2 posterolateral branches and the PDA are filled via collaterals from the LAD septals and diagonal branches  Prox Cx lesion, 100 %stenosed. 2 branches fill via collaterals from diagonal branch  Mid LAD lesion, 70 %stenosed. 1st Diag lesion, 80 %stenosed.  There is severe left ventricular systolic dysfunction as indicated by echocardiogram and Myoview stress test  LV end diastolic pressure is moderately elevated. 25 mmHg  There is no aortic valve stenosis.   Severe multivessel CAD with chronically occluded circumflex and RCA both filling via collaterals. The LAD itself is a long tubular 70% as well as the diagonal having focal 80%.    Echocardiogram 06/06/2017 Left ventricle: The cavity size was moderately dilated. Cheryl [name]   thickness was increased in a pattern of moderate LVH. Systolic   function was severely reduced. The estimated ejection fraction   was in the range of 25% to 30%. There is akinesis and scarring of   the inferolateral and inferior myocardium. Doppler parameters are    consistent with abnormal left ventricular relaxation (grade 1   diastolic dysfunction). - Aortic valve: Mildly calcified annulus. Trileaflet; moderately   calcified leaflets. Left coronary cusp mobility was restricted.   There was mild regurgitation. - Mitral valve: Calcified annulus. Mildly calcified leaflets .   There was mild regurgitation. - Left atrium: The atrium was moderately dilated. - Right atrium: Central venous pressure (est): 3 mm Hg. - Tricuspid valve: There was trivial regurgitation. - Pulmonary arteries: Systolic pressure could not be accurately   estimated. - Pericardium, extracardiac: There was no pericardial effusion.  CABG 06/21/2016 PREOPERATIVE DIAGNOSIS:  Severe three-vessel coronary disease with atypical angina and congestive heart failure with severe ischemic cardiomyopathy.  POSTOPERATIVE DIAGNOSIS:  Severe three-vessel coronary disease with atypical angina and congestive heart failure with severe ischemic cardiomyopathy.  PROCEDURE:   Median sternotomy, extracorporeal circulation, Coronary artery bypass grafting x 4      Left internal mammary artery to LAD,      Left radial artery to OM 1,      Saphenous vein graft to first diagonal,      Saphenous vein graft to posterior descending Endoscopic vein harvest left thigh.  SURGEON:  Salvatore Decent. Dorris Fetch, M.D.  ASSESSMENT AND PLAN:  1.  CAD: She has been doing very well and reports no cardiac symptoms. She remains extremely active, medically compliant, and continues to be followed both in [place] and in [place] by cardiology. We'll repeat labs for ongoing evaluation to include BMET, CBC, fasting lipids LFTs, and magnesium. Copies will be sent to her cardiologist in [place] and to primary care.  Refills (90 days) on apixaban, carvedilol, Lasix, losartan 25 mg, simvastatin, and potassium are provided. Other medication refills will be provided by primary care.  2. History of postoperative CHF:  Patient is echocardiogram postoperatively revealed an EF of 25%. I have checked with the patient to see if she has had a follow-up echocardiogram while she was down in [place] she does not remember. We have requested records if she has not we will repeat her echocardiogram and give her a call to schedule it. She denies any fluid retention or significant weight gain.  3, Hypertension: Excellent control of blood pressure at this time on losartan and carvedilol.   Current medicines are reviewed at length with the patient today.    Labs/ tests ordered today include: BMET, Lipids,  and LFTs, CBC, Mg.  Cheryl [name]. Cheryl [name], ANP, AACC   04/21/2017 3:14 PM    [facility] [address], [place], [place] [address] Phone: [phone]; Fax: [phone]

## 2017-04-21 NOTE — Patient Instructions (Signed)
Medication Instructions:  Your physician recommends that you continue on your current medications as directed. Please refer to the Current Medication list given to you today.   Labwork: Your physician recommends that you return for lab work in: Today    Testing/Procedures: NONE  Follow-Up: Your physician wants you to follow-up in: 1 Year. You will receive a reminder letter in the mail two months in advance. If you don't receive a letter, please call our office to schedule the follow-up appointment.   Any Other Special Instructions Will Be Listed Below (If Applicable).     If you need a refill on your cardiac medications before your next appointment, please call your pharmacy. Thank you for choosing [facility]!

## 2017-04-22 ENCOUNTER — Other Ambulatory Visit: Payer: Self-pay | Admitting: Adult Health

## 2017-04-22 DIAGNOSIS — D649 Anemia, unspecified: Secondary | ICD-10-CM | POA: Diagnosis not present

## 2017-04-22 DIAGNOSIS — I2510 Atherosclerotic heart disease of native coronary artery without angina pectoris: Secondary | ICD-10-CM | POA: Diagnosis not present

## 2017-04-22 DIAGNOSIS — Z79899 Other long term (current) drug therapy: Secondary | ICD-10-CM | POA: Diagnosis not present

## 2017-04-22 DIAGNOSIS — R309 Painful micturition, unspecified: Secondary | ICD-10-CM | POA: Diagnosis not present

## 2017-04-23 ENCOUNTER — Telehealth: Payer: Self-pay | Admitting: *Deleted

## 2017-04-23 LAB — MICROSCOPIC EXAMINATION: Casts: NONE SEEN /lpf

## 2017-04-23 LAB — BASIC METABOLIC PANEL
BUN/Creatinine Ratio: 14 (ref 12–28)
BUN: 11 mg/dL (ref 8–27)
CALCIUM: 9.5 mg/dL (ref 8.7–10.3)
CHLORIDE: 102 mmol/L (ref 96–106)
CO2: 24 mmol/L (ref 20–29)
Creatinine, Ser: 0.79 mg/dL (ref 0.57–1.00)
GFR calc Af Amer: 81 mL/min/{1.73_m2} (ref >59)
GFR calc non Af Amer: 70 mL/min/{1.73_m2} (ref >59)
GLUCOSE: 87 mg/dL (ref 65–99)
POTASSIUM: 4.7 mmol/L (ref 3.5–5.2)
Sodium: 139 mmol/L (ref 134–144)

## 2017-04-23 LAB — CBC/DIFF AMBIGUOUS DEFAULT
BASOS ABS: 0 10*3/uL (ref 0.0–0.2)
Basos: 0 %
EOS (ABSOLUTE): 0.2 10*3/uL (ref 0.0–0.4)
EOS: 5 %
HEMATOCRIT: 36.3 % (ref 34.0–46.6)
HEMOGLOBIN: 11.4 g/dL (ref 11.1–15.9)
IMMATURE GRANS (ABS): 0 10*3/uL (ref 0.0–0.1)
IMMATURE GRANULOCYTES: 0 %
LYMPHS ABS: 0.9 10*3/uL (ref 0.7–3.1)
LYMPHS: 23 %
MCH: 30 pg (ref 26.6–33.0)
MCHC: 31.4 g/dL — AB (ref 31.5–35.7)
MCV: 96 fL (ref 79–97)
MONOCYTES: 16 %
Monocytes Absolute: 0.6 10*3/uL (ref 0.1–0.9)
Neutrophils Absolute: 2.2 10*3/uL (ref 1.4–7.0)
Neutrophils: 56 %
Platelets: 222 10*3/uL (ref 150–379)
RBC: 3.8 x10E6/uL (ref 3.77–5.28)
RDW: 14 % (ref 12.3–15.4)
WBC: 3.9 10*3/uL (ref 3.4–10.8)

## 2017-04-23 LAB — HEPATIC FUNCTION PANEL
ALBUMIN: 4.3 g/dL (ref 3.5–4.7)
ALK PHOS: 62 IU/L (ref 39–117)
ALT: 10 IU/L (ref 0–32)
AST: 23 IU/L (ref 0–40)
Bilirubin Total: 0.5 mg/dL (ref 0.0–1.2)
Bilirubin, Direct: 0.17 mg/dL (ref 0.00–0.40)
Total Protein: 6.7 g/dL (ref 6.0–8.5)

## 2017-04-23 LAB — LIPID PANEL W/O CHOL/HDL RATIO
CHOLESTEROL TOTAL: 120 mg/dL (ref 100–199)
HDL: 58 mg/dL (ref >39)
LDL Calculated: 52 mg/dL (ref 0–99)
Triglycerides: 51 mg/dL (ref 0–149)
VLDL Cholesterol Cal: 10 mg/dL (ref 5–40)

## 2017-04-23 LAB — URINALYSIS, ROUTINE W REFLEX MICROSCOPIC
BILIRUBIN UA: NEGATIVE
Glucose, UA: NEGATIVE
KETONES UA: NEGATIVE
NITRITE UA: NEGATIVE
PH UA: 5.5 (ref 5.0–7.5)
Protein, UA: NEGATIVE
RBC UA: NEGATIVE
SPEC GRAV UA: 1.014 (ref 1.005–1.030)
UUROB: 0.2 mg/dL (ref 0.2–1.0)

## 2017-04-23 LAB — AMBIG ABBREV LP DEFAULT

## 2017-04-23 LAB — AMBIG ABBREV BMP8 DEFAULT

## 2017-04-23 LAB — AMBIG ABBREV HFP7 DEFAULT

## 2017-04-23 NOTE — Telephone Encounter (Signed)
-----   Message from Jodelle Gross, NP sent at 04/23/2017 11:36 AM EDT ----- Labs reviewed and found to be essentially normal. Elevated WBC noted in urine without evidence of infection. Similar to prior UA. No changes to medication regimen. Please send copies to Dr. Juanetta Gosling and cardiologist in [place], Dr. Darlina Guys.

## 2017-04-23 NOTE — Telephone Encounter (Signed)
Called patient with test results. No answer. Left message to call back.  

## 2017-04-23 NOTE — Telephone Encounter (Signed)
Copy of all test to be sent to Dr. Tory Emerald, cardiology. Ph [phone], [phone].

## 2017-05-11 DIAGNOSIS — N3000 Acute cystitis without hematuria: Secondary | ICD-10-CM | POA: Diagnosis not present

## 2017-06-02 DIAGNOSIS — I5022 Chronic systolic (congestive) heart failure: Secondary | ICD-10-CM | POA: Diagnosis not present

## 2017-06-02 DIAGNOSIS — I10 Essential (primary) hypertension: Secondary | ICD-10-CM | POA: Diagnosis not present

## 2017-06-02 DIAGNOSIS — I2510 Atherosclerotic heart disease of native coronary artery without angina pectoris: Secondary | ICD-10-CM | POA: Diagnosis not present

## 2017-06-02 DIAGNOSIS — N390 Urinary tract infection, site not specified: Secondary | ICD-10-CM | POA: Diagnosis not present

## 2017-06-02 DIAGNOSIS — K210 Gastro-esophageal reflux disease with esophagitis: Secondary | ICD-10-CM | POA: Diagnosis not present

## 2017-06-02 DIAGNOSIS — Z23 Encounter for immunization: Secondary | ICD-10-CM | POA: Diagnosis not present

## 2017-07-14 DIAGNOSIS — N3000 Acute cystitis without hematuria: Secondary | ICD-10-CM | POA: Diagnosis not present

## 2017-07-14 DIAGNOSIS — N3592 Unspecified urethral stricture, female: Secondary | ICD-10-CM | POA: Diagnosis not present

## 2017-07-17 DIAGNOSIS — N390 Urinary tract infection, site not specified: Secondary | ICD-10-CM | POA: Diagnosis not present

## 2017-07-21 DIAGNOSIS — N3592 Unspecified urethral stricture, female: Secondary | ICD-10-CM | POA: Diagnosis not present

## 2017-07-21 DIAGNOSIS — N390 Urinary tract infection, site not specified: Secondary | ICD-10-CM | POA: Diagnosis not present

## 2017-07-21 DIAGNOSIS — N3000 Acute cystitis without hematuria: Secondary | ICD-10-CM | POA: Diagnosis not present

## 2017-07-26 DIAGNOSIS — I509 Heart failure, unspecified: Secondary | ICD-10-CM | POA: Diagnosis not present

## 2017-07-26 DIAGNOSIS — I110 Hypertensive heart disease with heart failure: Secondary | ICD-10-CM | POA: Diagnosis not present

## 2017-07-26 DIAGNOSIS — R21 Rash and other nonspecific skin eruption: Secondary | ICD-10-CM | POA: Diagnosis not present

## 2017-07-29 DIAGNOSIS — I10 Essential (primary) hypertension: Secondary | ICD-10-CM | POA: Diagnosis not present

## 2017-07-29 DIAGNOSIS — Z6822 Body mass index (BMI) 22.0-22.9, adult: Secondary | ICD-10-CM | POA: Diagnosis not present

## 2017-07-29 DIAGNOSIS — E559 Vitamin D deficiency, unspecified: Secondary | ICD-10-CM | POA: Diagnosis not present

## 2017-07-29 DIAGNOSIS — M8580 Other specified disorders of bone density and structure, unspecified site: Secondary | ICD-10-CM | POA: Diagnosis not present

## 2017-07-29 DIAGNOSIS — F430 Acute stress reaction: Secondary | ICD-10-CM | POA: Diagnosis not present

## 2017-07-29 DIAGNOSIS — I25709 Atherosclerosis of coronary artery bypass graft(s), unspecified, with unspecified angina pectoris: Secondary | ICD-10-CM | POA: Diagnosis not present

## 2017-07-29 DIAGNOSIS — E7849 Other hyperlipidemia: Secondary | ICD-10-CM | POA: Diagnosis not present

## 2017-07-29 DIAGNOSIS — I2510 Atherosclerotic heart disease of native coronary artery without angina pectoris: Secondary | ICD-10-CM | POA: Diagnosis not present

## 2017-07-29 DIAGNOSIS — D649 Anemia, unspecified: Secondary | ICD-10-CM | POA: Diagnosis not present

## 2017-07-29 DIAGNOSIS — E039 Hypothyroidism, unspecified: Secondary | ICD-10-CM | POA: Diagnosis not present

## 2017-07-29 DIAGNOSIS — N390 Urinary tract infection, site not specified: Secondary | ICD-10-CM | POA: Diagnosis not present

## 2017-07-29 DIAGNOSIS — D691 Qualitative platelet defects: Secondary | ICD-10-CM | POA: Diagnosis not present

## 2017-08-07 DIAGNOSIS — E559 Vitamin D deficiency, unspecified: Secondary | ICD-10-CM | POA: Diagnosis not present

## 2017-08-07 DIAGNOSIS — E039 Hypothyroidism, unspecified: Secondary | ICD-10-CM | POA: Diagnosis not present

## 2017-08-07 DIAGNOSIS — E7849 Other hyperlipidemia: Secondary | ICD-10-CM | POA: Diagnosis not present

## 2017-08-07 DIAGNOSIS — R799 Abnormal finding of blood chemistry, unspecified: Secondary | ICD-10-CM | POA: Diagnosis not present

## 2017-08-07 DIAGNOSIS — D649 Anemia, unspecified: Secondary | ICD-10-CM | POA: Diagnosis not present

## 2017-08-07 DIAGNOSIS — I10 Essential (primary) hypertension: Secondary | ICD-10-CM | POA: Diagnosis not present

## 2017-08-07 DIAGNOSIS — N390 Urinary tract infection, site not specified: Secondary | ICD-10-CM | POA: Diagnosis not present

## 2017-08-14 DIAGNOSIS — H25042 Posterior subcapsular polar age-related cataract, left eye: Secondary | ICD-10-CM | POA: Diagnosis not present

## 2017-08-14 DIAGNOSIS — Z961 Presence of intraocular lens: Secondary | ICD-10-CM | POA: Diagnosis not present

## 2017-08-14 DIAGNOSIS — H353111 Nonexudative age-related macular degeneration, right eye, early dry stage: Secondary | ICD-10-CM | POA: Diagnosis not present

## 2017-08-31 DIAGNOSIS — H2512 Age-related nuclear cataract, left eye: Secondary | ICD-10-CM | POA: Diagnosis not present

## 2017-08-31 DIAGNOSIS — H25042 Posterior subcapsular polar age-related cataract, left eye: Secondary | ICD-10-CM | POA: Diagnosis not present

## 2017-09-03 DIAGNOSIS — E559 Vitamin D deficiency, unspecified: Secondary | ICD-10-CM | POA: Diagnosis not present

## 2017-09-03 DIAGNOSIS — F430 Acute stress reaction: Secondary | ICD-10-CM | POA: Diagnosis not present

## 2017-09-03 DIAGNOSIS — I10 Essential (primary) hypertension: Secondary | ICD-10-CM | POA: Diagnosis not present

## 2017-09-03 DIAGNOSIS — Z6822 Body mass index (BMI) 22.0-22.9, adult: Secondary | ICD-10-CM | POA: Diagnosis not present

## 2017-09-03 DIAGNOSIS — Z0000 Encounter for general adult medical examination without abnormal findings: Secondary | ICD-10-CM | POA: Diagnosis not present

## 2017-09-03 DIAGNOSIS — D691 Qualitative platelet defects: Secondary | ICD-10-CM | POA: Diagnosis not present

## 2017-09-03 DIAGNOSIS — Z1331 Encounter for screening for depression: Secondary | ICD-10-CM | POA: Diagnosis not present

## 2017-09-03 DIAGNOSIS — E7849 Other hyperlipidemia: Secondary | ICD-10-CM | POA: Diagnosis not present

## 2017-09-03 DIAGNOSIS — I25709 Atherosclerosis of coronary artery bypass graft(s), unspecified, with unspecified angina pectoris: Secondary | ICD-10-CM | POA: Diagnosis not present

## 2017-09-03 DIAGNOSIS — N390 Urinary tract infection, site not specified: Secondary | ICD-10-CM | POA: Diagnosis not present

## 2017-09-03 DIAGNOSIS — M8580 Other specified disorders of bone density and structure, unspecified site: Secondary | ICD-10-CM | POA: Diagnosis not present

## 2017-09-03 DIAGNOSIS — E039 Hypothyroidism, unspecified: Secondary | ICD-10-CM | POA: Diagnosis not present

## 2017-09-16 DIAGNOSIS — I2510 Atherosclerotic heart disease of native coronary artery without angina pectoris: Secondary | ICD-10-CM | POA: Diagnosis not present

## 2017-09-16 DIAGNOSIS — I519 Heart disease, unspecified: Secondary | ICD-10-CM | POA: Diagnosis not present

## 2017-09-16 DIAGNOSIS — I4891 Unspecified atrial fibrillation: Secondary | ICD-10-CM | POA: Diagnosis not present

## 2017-09-16 DIAGNOSIS — R0609 Other forms of dyspnea: Secondary | ICD-10-CM | POA: Diagnosis not present

## 2017-09-16 DIAGNOSIS — N390 Urinary tract infection, site not specified: Secondary | ICD-10-CM | POA: Diagnosis not present

## 2017-09-17 DIAGNOSIS — E7800 Pure hypercholesterolemia, unspecified: Secondary | ICD-10-CM | POA: Diagnosis not present

## 2017-09-17 DIAGNOSIS — I2510 Atherosclerotic heart disease of native coronary artery without angina pectoris: Secondary | ICD-10-CM | POA: Diagnosis not present

## 2017-09-17 DIAGNOSIS — I10 Essential (primary) hypertension: Secondary | ICD-10-CM | POA: Diagnosis not present

## 2017-09-17 DIAGNOSIS — D6489 Other specified anemias: Secondary | ICD-10-CM | POA: Diagnosis not present

## 2017-09-17 DIAGNOSIS — E871 Hypo-osmolality and hyponatremia: Secondary | ICD-10-CM | POA: Diagnosis not present

## 2017-09-21 DIAGNOSIS — E7800 Pure hypercholesterolemia, unspecified: Secondary | ICD-10-CM | POA: Diagnosis not present

## 2017-09-22 DIAGNOSIS — I2510 Atherosclerotic heart disease of native coronary artery without angina pectoris: Secondary | ICD-10-CM | POA: Diagnosis not present

## 2017-09-29 DIAGNOSIS — I340 Nonrheumatic mitral (valve) insufficiency: Secondary | ICD-10-CM | POA: Diagnosis not present

## 2017-09-29 DIAGNOSIS — I519 Heart disease, unspecified: Secondary | ICD-10-CM | POA: Diagnosis not present

## 2017-09-29 DIAGNOSIS — I351 Nonrheumatic aortic (valve) insufficiency: Secondary | ICD-10-CM | POA: Diagnosis not present

## 2017-10-02 DIAGNOSIS — E871 Hypo-osmolality and hyponatremia: Secondary | ICD-10-CM | POA: Diagnosis not present

## 2017-10-03 DIAGNOSIS — I10 Essential (primary) hypertension: Secondary | ICD-10-CM | POA: Diagnosis not present

## 2017-10-03 DIAGNOSIS — N390 Urinary tract infection, site not specified: Secondary | ICD-10-CM | POA: Diagnosis not present

## 2017-10-03 DIAGNOSIS — E559 Vitamin D deficiency, unspecified: Secondary | ICD-10-CM | POA: Diagnosis not present

## 2017-10-03 DIAGNOSIS — D649 Anemia, unspecified: Secondary | ICD-10-CM | POA: Diagnosis not present

## 2017-10-03 DIAGNOSIS — E039 Hypothyroidism, unspecified: Secondary | ICD-10-CM | POA: Diagnosis not present

## 2017-10-03 DIAGNOSIS — E7849 Other hyperlipidemia: Secondary | ICD-10-CM | POA: Diagnosis not present

## 2017-10-06 DIAGNOSIS — L821 Other seborrheic keratosis: Secondary | ICD-10-CM | POA: Diagnosis not present

## 2017-10-07 DIAGNOSIS — E871 Hypo-osmolality and hyponatremia: Secondary | ICD-10-CM | POA: Diagnosis not present

## 2017-10-07 DIAGNOSIS — R0609 Other forms of dyspnea: Secondary | ICD-10-CM | POA: Diagnosis not present

## 2017-10-07 DIAGNOSIS — I4891 Unspecified atrial fibrillation: Secondary | ICD-10-CM | POA: Diagnosis not present

## 2017-10-07 DIAGNOSIS — I2510 Atherosclerotic heart disease of native coronary artery without angina pectoris: Secondary | ICD-10-CM | POA: Diagnosis not present

## 2017-10-12 DIAGNOSIS — M818 Other osteoporosis without current pathological fracture: Secondary | ICD-10-CM | POA: Diagnosis not present

## 2017-10-12 DIAGNOSIS — I25709 Atherosclerosis of coronary artery bypass graft(s), unspecified, with unspecified angina pectoris: Secondary | ICD-10-CM | POA: Diagnosis not present

## 2017-10-12 DIAGNOSIS — E039 Hypothyroidism, unspecified: Secondary | ICD-10-CM | POA: Diagnosis not present

## 2017-10-12 DIAGNOSIS — D638 Anemia in other chronic diseases classified elsewhere: Secondary | ICD-10-CM | POA: Diagnosis not present

## 2017-10-12 DIAGNOSIS — E559 Vitamin D deficiency, unspecified: Secondary | ICD-10-CM | POA: Diagnosis not present

## 2017-10-12 DIAGNOSIS — Z6821 Body mass index (BMI) 21.0-21.9, adult: Secondary | ICD-10-CM | POA: Diagnosis not present

## 2017-10-12 DIAGNOSIS — G4700 Insomnia, unspecified: Secondary | ICD-10-CM | POA: Diagnosis not present

## 2017-10-12 DIAGNOSIS — D691 Qualitative platelet defects: Secondary | ICD-10-CM | POA: Diagnosis not present

## 2017-10-12 DIAGNOSIS — M8580 Other specified disorders of bone density and structure, unspecified site: Secondary | ICD-10-CM | POA: Diagnosis not present

## 2017-10-12 DIAGNOSIS — I10 Essential (primary) hypertension: Secondary | ICD-10-CM | POA: Diagnosis not present

## 2017-10-12 DIAGNOSIS — E7849 Other hyperlipidemia: Secondary | ICD-10-CM | POA: Diagnosis not present

## 2017-10-12 DIAGNOSIS — R7309 Other abnormal glucose: Secondary | ICD-10-CM | POA: Diagnosis not present

## 2017-10-12 DIAGNOSIS — F430 Acute stress reaction: Secondary | ICD-10-CM | POA: Diagnosis not present

## 2017-10-12 DIAGNOSIS — N390 Urinary tract infection, site not specified: Secondary | ICD-10-CM | POA: Diagnosis not present

## 2017-10-15 DIAGNOSIS — I519 Heart disease, unspecified: Secondary | ICD-10-CM | POA: Diagnosis not present

## 2017-10-15 DIAGNOSIS — I501 Left ventricular failure: Secondary | ICD-10-CM | POA: Diagnosis not present

## 2017-10-19 DIAGNOSIS — N390 Urinary tract infection, site not specified: Secondary | ICD-10-CM | POA: Diagnosis not present

## 2017-10-20 DIAGNOSIS — N3000 Acute cystitis without hematuria: Secondary | ICD-10-CM | POA: Diagnosis not present

## 2017-10-20 DIAGNOSIS — N3592 Unspecified urethral stricture, female: Secondary | ICD-10-CM | POA: Diagnosis not present

## 2017-10-28 DIAGNOSIS — E871 Hypo-osmolality and hyponatremia: Secondary | ICD-10-CM | POA: Diagnosis not present

## 2017-10-28 DIAGNOSIS — I10 Essential (primary) hypertension: Secondary | ICD-10-CM | POA: Diagnosis not present

## 2017-10-28 DIAGNOSIS — I2510 Atherosclerotic heart disease of native coronary artery without angina pectoris: Secondary | ICD-10-CM | POA: Diagnosis not present

## 2017-10-28 DIAGNOSIS — I4891 Unspecified atrial fibrillation: Secondary | ICD-10-CM | POA: Diagnosis not present

## 2017-10-28 DIAGNOSIS — I519 Heart disease, unspecified: Secondary | ICD-10-CM | POA: Diagnosis not present

## 2017-11-05 IMAGING — NM NM MYOCAR MULTI W/SPECT W/WALL MOTION & EF
2 series · 12 of 12 positions shown · non-contrast
Comparison: none

[Series 1: rest · 8.28mm/px · 6 of 64 frames shown]
[frame 6/64]
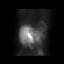
[frame 16/64]
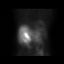
[frame 27/64]
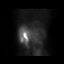
[frame 38/64]
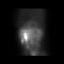
[frame 48/64]
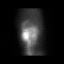
[frame 59/64]
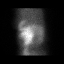

[Series 3: stress gated - perfusion · 8.28mm/px · 6 of 64 frames shown]
[frame 6/64]
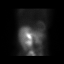
[frame 16/64]
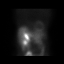
[frame 27/64]
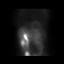
[frame 38/64]
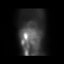
[frame 48/64]
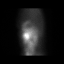
[frame 59/64]
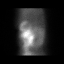

[12 of 12 positions shown; findings below may reference images not displayed]

Canned report from images found in remote index.

Refer to host system for actual result text.

## 2017-11-11 DIAGNOSIS — H2512 Age-related nuclear cataract, left eye: Secondary | ICD-10-CM | POA: Diagnosis not present

## 2017-11-12 DIAGNOSIS — I4892 Unspecified atrial flutter: Secondary | ICD-10-CM | POA: Diagnosis not present

## 2017-11-12 DIAGNOSIS — Z961 Presence of intraocular lens: Secondary | ICD-10-CM | POA: Diagnosis not present

## 2017-11-17 DIAGNOSIS — N390 Urinary tract infection, site not specified: Secondary | ICD-10-CM | POA: Diagnosis not present

## 2017-11-17 DIAGNOSIS — E039 Hypothyroidism, unspecified: Secondary | ICD-10-CM | POA: Diagnosis not present

## 2017-11-17 DIAGNOSIS — I10 Essential (primary) hypertension: Secondary | ICD-10-CM | POA: Diagnosis not present

## 2017-11-17 DIAGNOSIS — I25709 Atherosclerosis of coronary artery bypass graft(s), unspecified, with unspecified angina pectoris: Secondary | ICD-10-CM | POA: Diagnosis not present

## 2017-11-17 DIAGNOSIS — R7309 Other abnormal glucose: Secondary | ICD-10-CM | POA: Diagnosis not present

## 2017-11-17 DIAGNOSIS — E7849 Other hyperlipidemia: Secondary | ICD-10-CM | POA: Diagnosis not present

## 2017-11-17 DIAGNOSIS — E559 Vitamin D deficiency, unspecified: Secondary | ICD-10-CM | POA: Diagnosis not present

## 2017-11-17 DIAGNOSIS — E871 Hypo-osmolality and hyponatremia: Secondary | ICD-10-CM | POA: Diagnosis not present

## 2017-11-18 DIAGNOSIS — E871 Hypo-osmolality and hyponatremia: Secondary | ICD-10-CM | POA: Diagnosis not present

## 2017-11-20 DIAGNOSIS — Z961 Presence of intraocular lens: Secondary | ICD-10-CM | POA: Diagnosis not present

## 2017-11-27 DIAGNOSIS — N390 Urinary tract infection, site not specified: Secondary | ICD-10-CM | POA: Diagnosis not present

## 2017-11-29 IMAGING — CR DG CHEST 2V
2 series · 2 of 2 positions shown · non-contrast
Comparison: Chest x-ray of 04/28/2016

CLINICAL DATA: Preop for CABG .

EXAM:
CHEST  2 VIEW

[w chest pa]
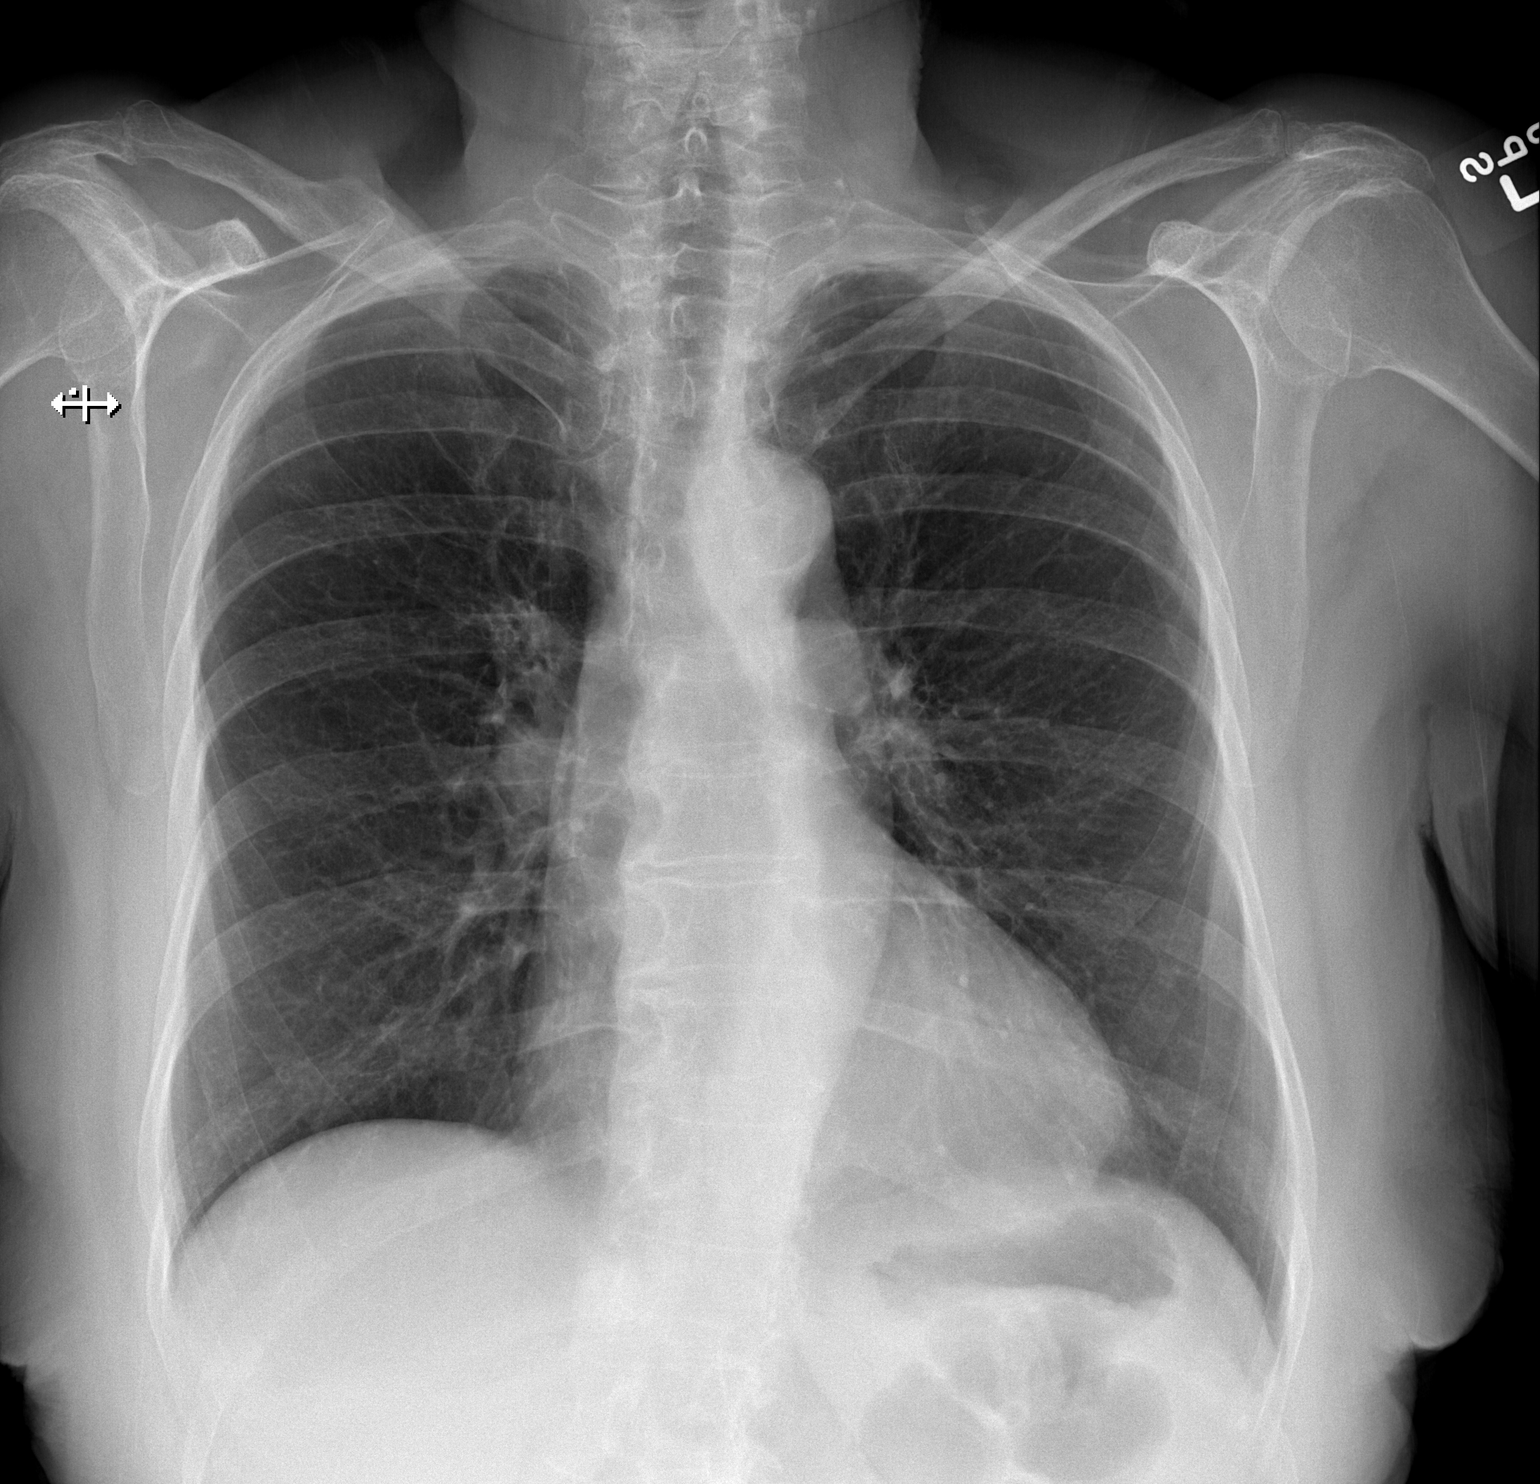

[w chest lat]
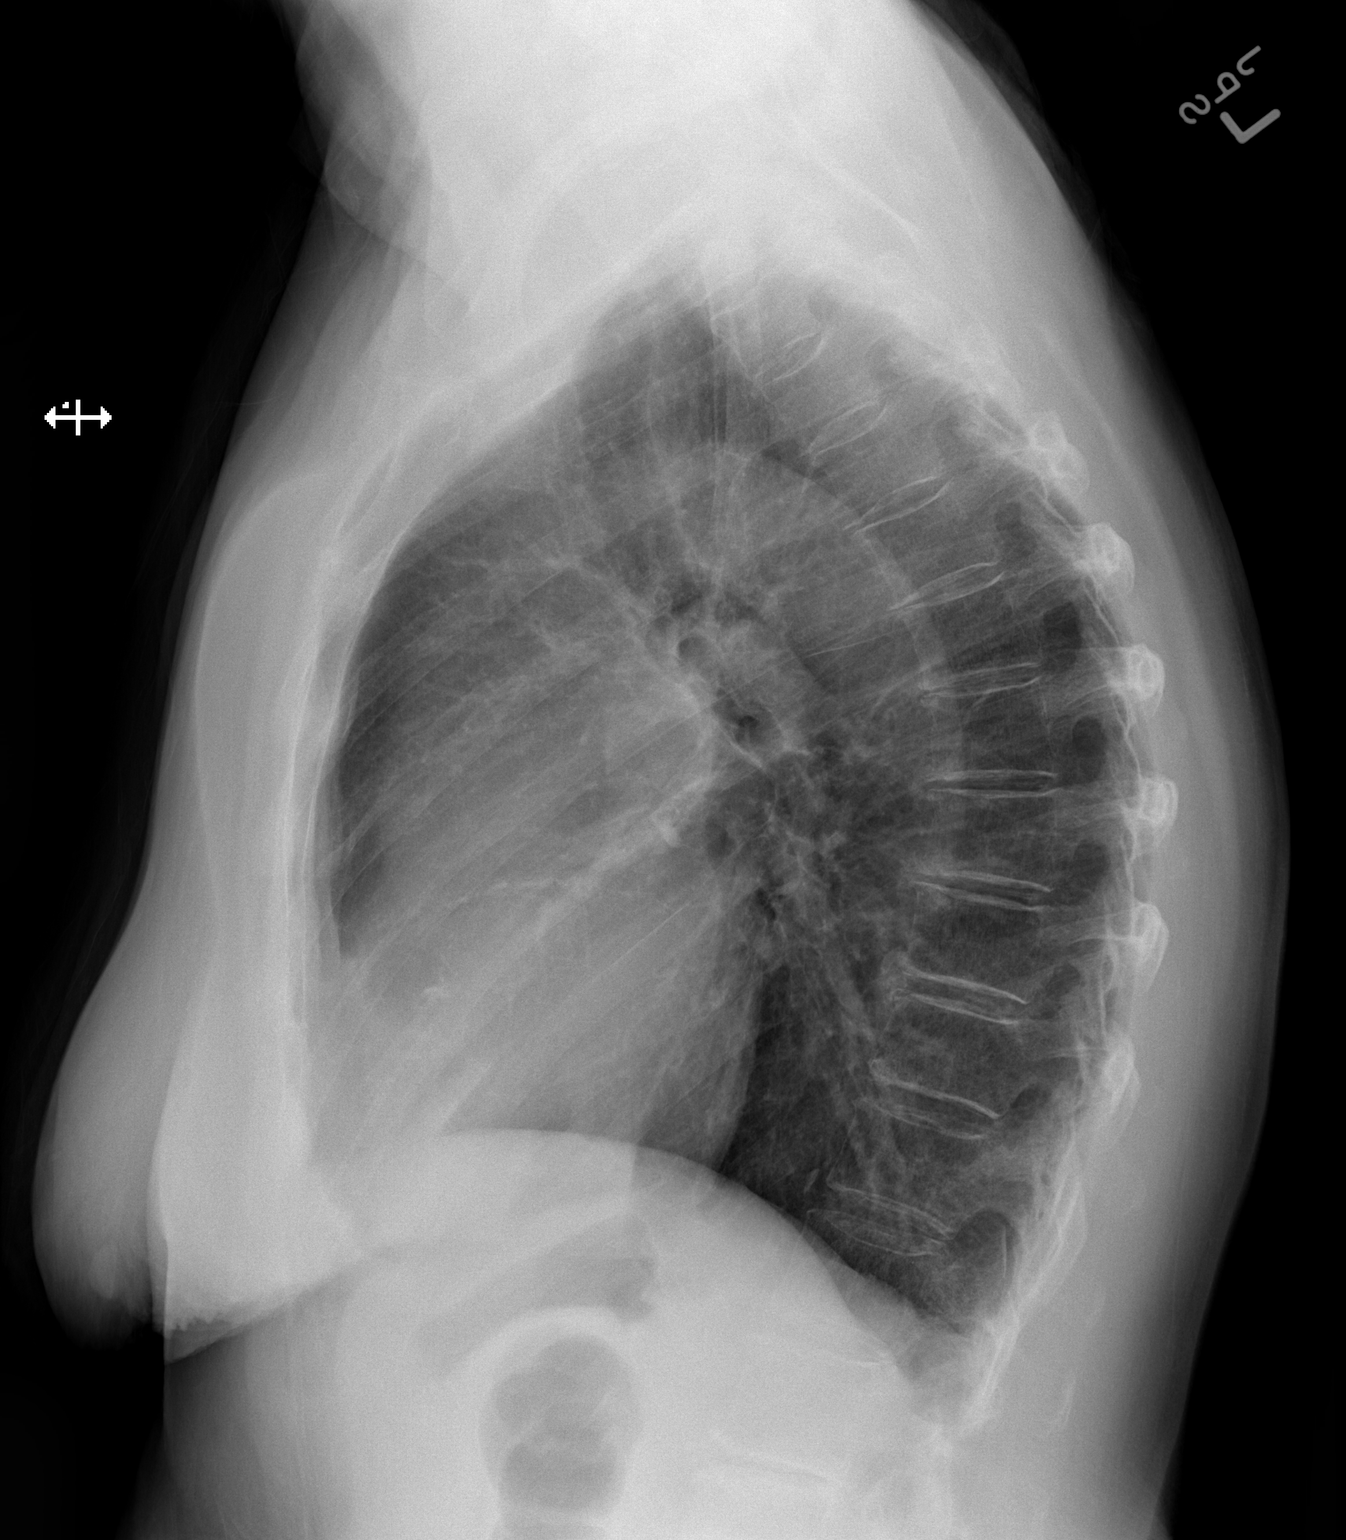

[2 of 2 positions shown; findings below may reference images not displayed]

FINDINGS: The lungs are clear and somewhat hyperaerated. Mediastinal and hilar
contours are unremarkable. The heart is within upper limits of
normal. No acute bony abnormality seen, with probable old healed
fracture of the mid right clavicle noted
IMPRESSION: No active cardiopulmonary disease.

## 2017-12-01 IMAGING — CR DG CHEST 1V PORT
1 series · 1 of 1 positions shown · non-contrast
Comparison: 06/20/2016

CLINICAL DATA: Followup CABG

EXAM:
PORTABLE CHEST 1 VIEW

[AP]
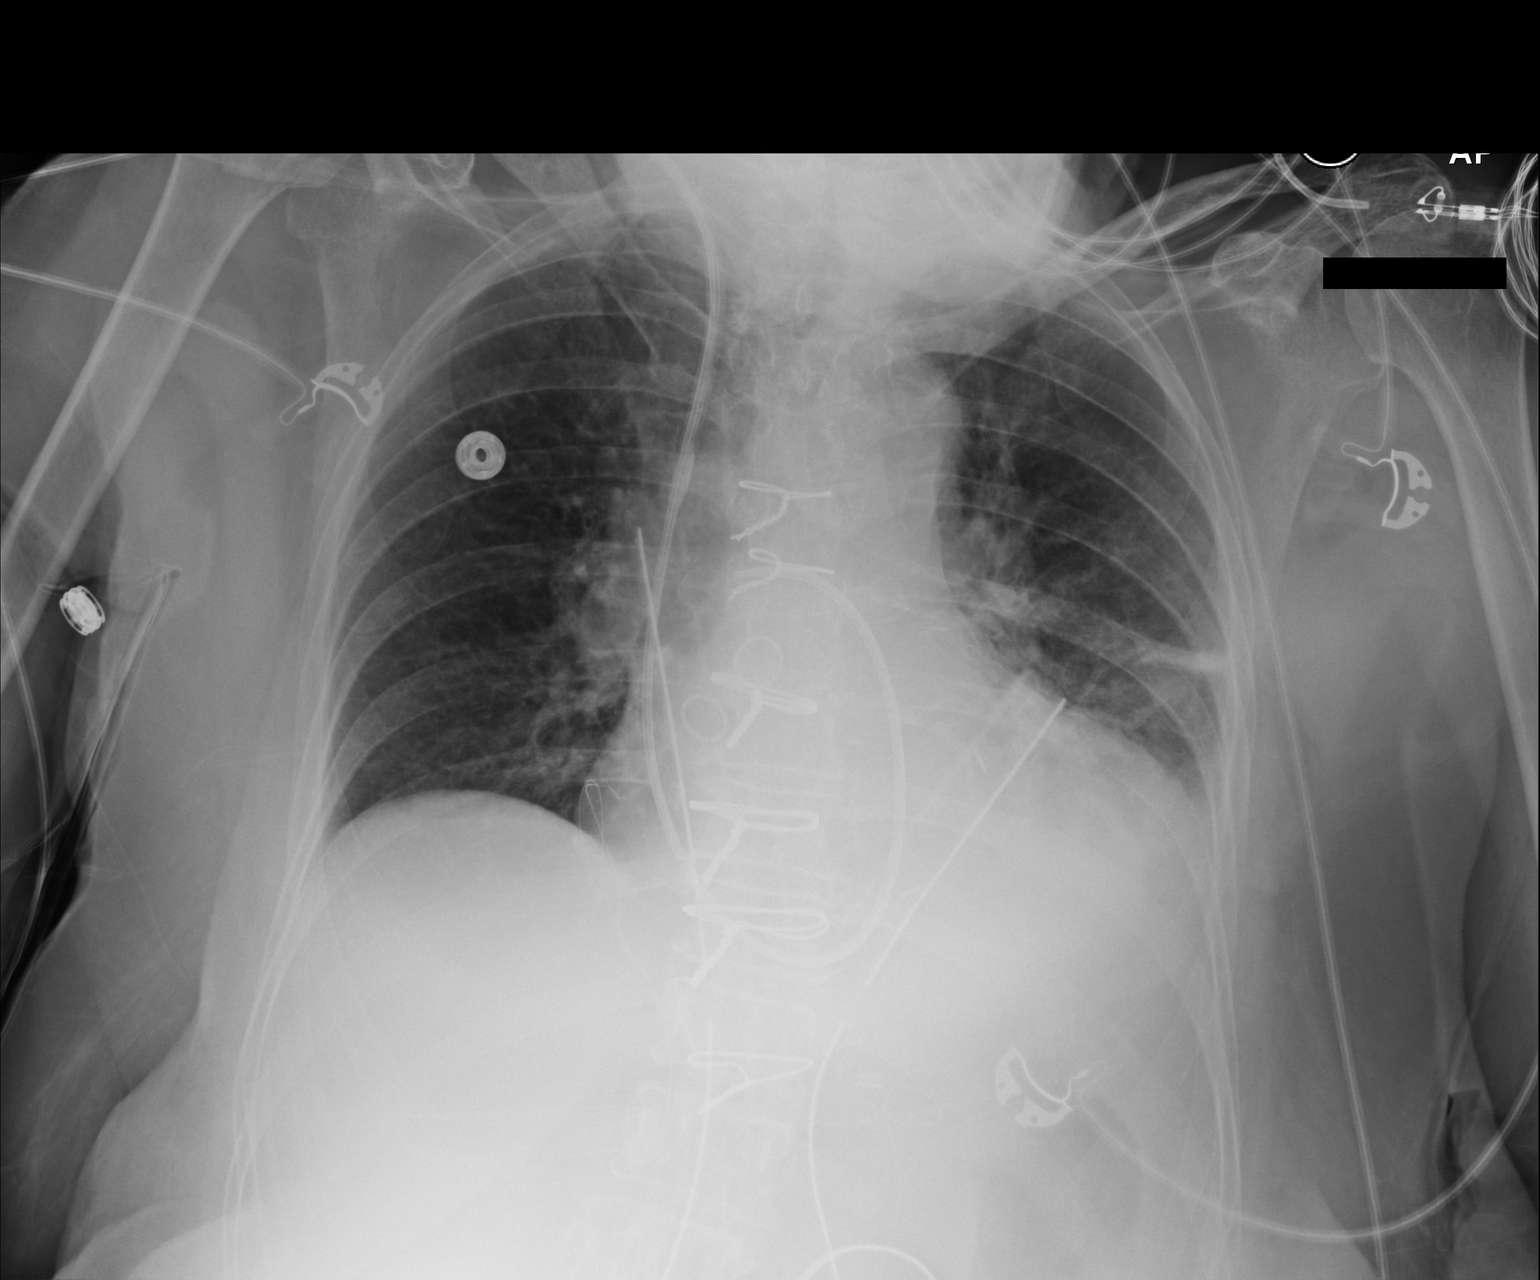

[1 of 1 positions shown; findings below may reference images not displayed]

FINDINGS: Endotracheal tube and nasogastric tube have been removed. Swan-Ganz
catheter has its tip in the right main pulmonary artery. Chest
tube/mediastinal drains remain in place. No pneumothorax.
Atelectasis in the left lower lobe has worsened somewhat following
extubation.
IMPRESSION: Some worsening of left lower lobe atelectasis postextubation.

## 2017-12-09 DIAGNOSIS — E871 Hypo-osmolality and hyponatremia: Secondary | ICD-10-CM | POA: Diagnosis not present

## 2017-12-09 DIAGNOSIS — N182 Chronic kidney disease, stage 2 (mild): Secondary | ICD-10-CM | POA: Diagnosis not present

## 2017-12-22 DIAGNOSIS — N390 Urinary tract infection, site not specified: Secondary | ICD-10-CM | POA: Diagnosis not present

## 2018-01-08 DIAGNOSIS — R300 Dysuria: Secondary | ICD-10-CM | POA: Diagnosis not present

## 2018-01-08 DIAGNOSIS — N390 Urinary tract infection, site not specified: Secondary | ICD-10-CM | POA: Diagnosis not present

## 2018-01-12 DIAGNOSIS — Z6822 Body mass index (BMI) 22.0-22.9, adult: Secondary | ICD-10-CM | POA: Diagnosis not present

## 2018-01-12 DIAGNOSIS — I10 Essential (primary) hypertension: Secondary | ICD-10-CM | POA: Diagnosis not present

## 2018-01-12 DIAGNOSIS — M8580 Other specified disorders of bone density and structure, unspecified site: Secondary | ICD-10-CM | POA: Diagnosis not present

## 2018-01-12 DIAGNOSIS — D6869 Other thrombophilia: Secondary | ICD-10-CM | POA: Diagnosis not present

## 2018-01-12 DIAGNOSIS — I25709 Atherosclerosis of coronary artery bypass graft(s), unspecified, with unspecified angina pectoris: Secondary | ICD-10-CM | POA: Diagnosis not present

## 2018-01-12 DIAGNOSIS — Z1389 Encounter for screening for other disorder: Secondary | ICD-10-CM | POA: Diagnosis not present

## 2018-01-12 DIAGNOSIS — E7849 Other hyperlipidemia: Secondary | ICD-10-CM | POA: Diagnosis not present

## 2018-01-12 DIAGNOSIS — E039 Hypothyroidism, unspecified: Secondary | ICD-10-CM | POA: Diagnosis not present

## 2018-01-12 DIAGNOSIS — F430 Acute stress reaction: Secondary | ICD-10-CM | POA: Diagnosis not present

## 2018-01-12 DIAGNOSIS — N390 Urinary tract infection, site not specified: Secondary | ICD-10-CM | POA: Diagnosis not present

## 2018-01-12 DIAGNOSIS — D638 Anemia in other chronic diseases classified elsewhere: Secondary | ICD-10-CM | POA: Diagnosis not present

## 2018-01-12 DIAGNOSIS — D691 Qualitative platelet defects: Secondary | ICD-10-CM | POA: Diagnosis not present

## 2018-01-12 DIAGNOSIS — I480 Paroxysmal atrial fibrillation: Secondary | ICD-10-CM | POA: Diagnosis not present

## 2018-01-27 DIAGNOSIS — N390 Urinary tract infection, site not specified: Secondary | ICD-10-CM | POA: Diagnosis not present

## 2018-01-27 DIAGNOSIS — R300 Dysuria: Secondary | ICD-10-CM | POA: Diagnosis not present

## 2018-01-28 DIAGNOSIS — I493 Ventricular premature depolarization: Secondary | ICD-10-CM | POA: Diagnosis not present

## 2018-01-28 DIAGNOSIS — I480 Paroxysmal atrial fibrillation: Secondary | ICD-10-CM | POA: Diagnosis not present

## 2018-01-28 DIAGNOSIS — I491 Atrial premature depolarization: Secondary | ICD-10-CM | POA: Diagnosis not present

## 2018-01-28 DIAGNOSIS — I2510 Atherosclerotic heart disease of native coronary artery without angina pectoris: Secondary | ICD-10-CM | POA: Diagnosis not present

## 2018-01-28 DIAGNOSIS — I471 Supraventricular tachycardia: Secondary | ICD-10-CM | POA: Diagnosis not present

## 2018-01-28 DIAGNOSIS — Z7901 Long term (current) use of anticoagulants: Secondary | ICD-10-CM | POA: Diagnosis not present

## 2018-02-17 ENCOUNTER — Other Ambulatory Visit (HOSPITAL_COMMUNITY): Payer: Self-pay | Admitting: Pulmonary Disease

## 2018-02-17 DIAGNOSIS — Z1231 Encounter for screening mammogram for malignant neoplasm of breast: Secondary | ICD-10-CM

## 2018-02-18 DIAGNOSIS — Z9049 Acquired absence of other specified parts of digestive tract: Secondary | ICD-10-CM | POA: Diagnosis not present

## 2018-02-18 DIAGNOSIS — K5730 Diverticulosis of large intestine without perforation or abscess without bleeding: Secondary | ICD-10-CM | POA: Diagnosis not present

## 2018-02-18 DIAGNOSIS — Z79899 Other long term (current) drug therapy: Secondary | ICD-10-CM | POA: Diagnosis not present

## 2018-02-18 DIAGNOSIS — M150 Primary generalized (osteo)arthritis: Secondary | ICD-10-CM | POA: Diagnosis not present

## 2018-02-18 DIAGNOSIS — I10 Essential (primary) hypertension: Secondary | ICD-10-CM | POA: Diagnosis not present

## 2018-02-18 DIAGNOSIS — M5136 Other intervertebral disc degeneration, lumbar region: Secondary | ICD-10-CM | POA: Diagnosis not present

## 2018-02-18 DIAGNOSIS — Z951 Presence of aortocoronary bypass graft: Secondary | ICD-10-CM | POA: Diagnosis not present

## 2018-02-18 DIAGNOSIS — I2510 Atherosclerotic heart disease of native coronary artery without angina pectoris: Secondary | ICD-10-CM | POA: Diagnosis not present

## 2018-02-18 DIAGNOSIS — E782 Mixed hyperlipidemia: Secondary | ICD-10-CM | POA: Diagnosis not present

## 2018-02-18 DIAGNOSIS — N3080 Other cystitis without hematuria: Secondary | ICD-10-CM | POA: Diagnosis not present

## 2018-02-24 ENCOUNTER — Ambulatory Visit (HOSPITAL_COMMUNITY)
Admission: RE | Admit: 2018-02-24 | Discharge: 2018-02-24 | Disposition: A | Discharge status: Home / Self Care | Payer: Medicare Other | Source: Ambulatory Visit | Attending: Pulmonary Disease | Admitting: Pulmonary Disease

## 2018-02-24 ENCOUNTER — Encounter (HOSPITAL_COMMUNITY): Payer: Self-pay

## 2018-02-24 DIAGNOSIS — Z1231 Encounter for screening mammogram for malignant neoplasm of breast: Secondary | ICD-10-CM | POA: Diagnosis not present

## 2018-03-22 DIAGNOSIS — N390 Urinary tract infection, site not specified: Secondary | ICD-10-CM | POA: Diagnosis not present

## 2018-04-07 DIAGNOSIS — N390 Urinary tract infection, site not specified: Secondary | ICD-10-CM | POA: Diagnosis not present

## 2018-04-07 DIAGNOSIS — I10 Essential (primary) hypertension: Secondary | ICD-10-CM | POA: Diagnosis not present

## 2018-04-07 DIAGNOSIS — I482 Chronic atrial fibrillation: Secondary | ICD-10-CM | POA: Diagnosis not present

## 2018-04-07 DIAGNOSIS — I5022 Chronic systolic (congestive) heart failure: Secondary | ICD-10-CM | POA: Diagnosis not present

## 2018-04-19 DIAGNOSIS — I10 Essential (primary) hypertension: Secondary | ICD-10-CM | POA: Diagnosis not present

## 2018-04-19 DIAGNOSIS — K5730 Diverticulosis of large intestine without perforation or abscess without bleeding: Secondary | ICD-10-CM | POA: Diagnosis not present

## 2018-04-19 DIAGNOSIS — N390 Urinary tract infection, site not specified: Secondary | ICD-10-CM | POA: Diagnosis not present

## 2018-04-19 DIAGNOSIS — H903 Sensorineural hearing loss, bilateral: Secondary | ICD-10-CM | POA: Diagnosis not present

## 2018-04-19 DIAGNOSIS — Z23 Encounter for immunization: Secondary | ICD-10-CM | POA: Diagnosis not present

## 2018-04-19 DIAGNOSIS — Z79899 Other long term (current) drug therapy: Secondary | ICD-10-CM | POA: Diagnosis not present

## 2018-04-19 DIAGNOSIS — Z951 Presence of aortocoronary bypass graft: Secondary | ICD-10-CM | POA: Diagnosis not present

## 2018-04-19 DIAGNOSIS — M5136 Other intervertebral disc degeneration, lumbar region: Secondary | ICD-10-CM | POA: Diagnosis not present

## 2018-04-19 DIAGNOSIS — Z9049 Acquired absence of other specified parts of digestive tract: Secondary | ICD-10-CM | POA: Diagnosis not present

## 2018-04-19 DIAGNOSIS — N3080 Other cystitis without hematuria: Secondary | ICD-10-CM | POA: Diagnosis not present

## 2018-04-19 DIAGNOSIS — M150 Primary generalized (osteo)arthritis: Secondary | ICD-10-CM | POA: Diagnosis not present

## 2018-04-19 DIAGNOSIS — I2510 Atherosclerotic heart disease of native coronary artery without angina pectoris: Secondary | ICD-10-CM | POA: Diagnosis not present

## 2018-04-19 DIAGNOSIS — E782 Mixed hyperlipidemia: Secondary | ICD-10-CM | POA: Diagnosis not present

## 2018-04-19 DIAGNOSIS — E069 Thyroiditis, unspecified: Secondary | ICD-10-CM | POA: Diagnosis not present

## 2018-04-26 DIAGNOSIS — N3020 Other chronic cystitis without hematuria: Secondary | ICD-10-CM | POA: Diagnosis not present

## 2018-05-03 DIAGNOSIS — D72819 Decreased white blood cell count, unspecified: Secondary | ICD-10-CM | POA: Diagnosis not present

## 2018-05-19 DIAGNOSIS — D509 Iron deficiency anemia, unspecified: Secondary | ICD-10-CM | POA: Diagnosis not present

## 2018-05-20 DIAGNOSIS — K5730 Diverticulosis of large intestine without perforation or abscess without bleeding: Secondary | ICD-10-CM | POA: Diagnosis not present

## 2018-05-20 DIAGNOSIS — M150 Primary generalized (osteo)arthritis: Secondary | ICD-10-CM | POA: Diagnosis not present

## 2018-05-20 DIAGNOSIS — I2510 Atherosclerotic heart disease of native coronary artery without angina pectoris: Secondary | ICD-10-CM | POA: Diagnosis not present

## 2018-05-20 DIAGNOSIS — E782 Mixed hyperlipidemia: Secondary | ICD-10-CM | POA: Diagnosis not present

## 2018-05-20 DIAGNOSIS — Z79899 Other long term (current) drug therapy: Secondary | ICD-10-CM | POA: Diagnosis not present

## 2018-05-20 DIAGNOSIS — Z9049 Acquired absence of other specified parts of digestive tract: Secondary | ICD-10-CM | POA: Diagnosis not present

## 2018-05-20 DIAGNOSIS — R82998 Other abnormal findings in urine: Secondary | ICD-10-CM | POA: Diagnosis not present

## 2018-05-20 DIAGNOSIS — N3080 Other cystitis without hematuria: Secondary | ICD-10-CM | POA: Diagnosis not present

## 2018-05-20 DIAGNOSIS — M5136 Other intervertebral disc degeneration, lumbar region: Secondary | ICD-10-CM | POA: Diagnosis not present

## 2018-05-20 DIAGNOSIS — Z951 Presence of aortocoronary bypass graft: Secondary | ICD-10-CM | POA: Diagnosis not present

## 2018-05-20 DIAGNOSIS — R300 Dysuria: Secondary | ICD-10-CM | POA: Diagnosis not present

## 2018-05-20 DIAGNOSIS — I10 Essential (primary) hypertension: Secondary | ICD-10-CM | POA: Diagnosis not present

## 2018-05-20 DIAGNOSIS — G459 Transient cerebral ischemic attack, unspecified: Secondary | ICD-10-CM | POA: Diagnosis not present

## 2018-08-06 DIAGNOSIS — I480 Paroxysmal atrial fibrillation: Secondary | ICD-10-CM | POA: Diagnosis not present

## 2018-08-06 DIAGNOSIS — R7309 Other abnormal glucose: Secondary | ICD-10-CM | POA: Diagnosis not present

## 2018-08-06 DIAGNOSIS — E039 Hypothyroidism, unspecified: Secondary | ICD-10-CM | POA: Diagnosis not present

## 2018-08-06 DIAGNOSIS — Z6822 Body mass index (BMI) 22.0-22.9, adult: Secondary | ICD-10-CM | POA: Diagnosis not present

## 2018-08-06 DIAGNOSIS — K5900 Constipation, unspecified: Secondary | ICD-10-CM | POA: Diagnosis not present

## 2018-08-06 DIAGNOSIS — Z1331 Encounter for screening for depression: Secondary | ICD-10-CM | POA: Diagnosis not present

## 2018-08-06 DIAGNOSIS — I10 Essential (primary) hypertension: Secondary | ICD-10-CM | POA: Diagnosis not present

## 2018-08-06 DIAGNOSIS — N390 Urinary tract infection, site not specified: Secondary | ICD-10-CM | POA: Diagnosis not present

## 2018-08-06 DIAGNOSIS — D638 Anemia in other chronic diseases classified elsewhere: Secondary | ICD-10-CM | POA: Diagnosis not present

## 2018-08-06 DIAGNOSIS — E559 Vitamin D deficiency, unspecified: Secondary | ICD-10-CM | POA: Diagnosis not present

## 2022-10-10 DEATH — deceased
